# Patient Record
Sex: Male | Born: 1970 | Race: White | Hispanic: No | Smoking: Never smoker
Health system: Southern US, Community
[De-identification: ages and names within clinical notes are randomized; demographics above are authoritative.]

---

## 2011-08-28 ENCOUNTER — Emergency Department (HOSPITAL_COMMUNITY): Payer: No Typology Code available for payment source

## 2011-08-28 ENCOUNTER — Emergency Department (HOSPITAL_COMMUNITY): Payer: No Typology Code available for payment source | Admitting: Certified Registered"

## 2011-08-28 ENCOUNTER — Encounter (HOSPITAL_COMMUNITY): Admission: EM | Disposition: A | Discharge status: Home Health | Payer: Self-pay | Source: Home / Self Care

## 2011-08-28 ENCOUNTER — Encounter (HOSPITAL_COMMUNITY): Payer: Self-pay | Admitting: Certified Registered"

## 2011-08-28 ENCOUNTER — Encounter: Payer: Self-pay | Admitting: Physical Medicine and Rehabilitation

## 2011-08-28 ENCOUNTER — Inpatient Hospital Stay (HOSPITAL_COMMUNITY)
Admission: EM | Admit: 2011-08-28 | Discharge: 2011-09-02 | DRG: 481 | Disposition: A | Discharge status: Home Health | Payer: No Typology Code available for payment source | Attending: General Surgery | Admitting: General Surgery

## 2011-08-28 DIAGNOSIS — S7291XA Unspecified fracture of right femur, initial encounter for closed fracture: Secondary | ICD-10-CM

## 2011-08-28 DIAGNOSIS — G4730 Sleep apnea, unspecified: Secondary | ICD-10-CM | POA: Diagnosis present

## 2011-08-28 DIAGNOSIS — S301XXA Contusion of abdominal wall, initial encounter: Secondary | ICD-10-CM | POA: Diagnosis present

## 2011-08-28 DIAGNOSIS — S61219A Laceration without foreign body of unspecified finger without damage to nail, initial encounter: Secondary | ICD-10-CM

## 2011-08-28 DIAGNOSIS — T07XXXA Unspecified multiple injuries, initial encounter: Secondary | ICD-10-CM

## 2011-08-28 DIAGNOSIS — S7290XA Unspecified fracture of unspecified femur, initial encounter for closed fracture: Secondary | ICD-10-CM

## 2011-08-28 DIAGNOSIS — IMO0002 Reserved for concepts with insufficient information to code with codable children: Secondary | ICD-10-CM | POA: Diagnosis present

## 2011-08-28 DIAGNOSIS — S72351A Displaced comminuted fracture of shaft of right femur, initial encounter for closed fracture: Secondary | ICD-10-CM | POA: Diagnosis present

## 2011-08-28 DIAGNOSIS — S7223XA Displaced subtrochanteric fracture of unspecified femur, initial encounter for closed fracture: Principal | ICD-10-CM | POA: Diagnosis present

## 2011-08-28 DIAGNOSIS — R000 Tachycardia, unspecified: Secondary | ICD-10-CM | POA: Diagnosis not present

## 2011-08-28 DIAGNOSIS — Y92410 Unspecified street and highway as the place of occurrence of the external cause: Secondary | ICD-10-CM

## 2011-08-28 DIAGNOSIS — S82841A Displaced bimalleolar fracture of right lower leg, initial encounter for closed fracture: Secondary | ICD-10-CM | POA: Diagnosis present

## 2011-08-28 DIAGNOSIS — D62 Acute posthemorrhagic anemia: Secondary | ICD-10-CM | POA: Diagnosis not present

## 2011-08-28 DIAGNOSIS — S7221XA Displaced subtrochanteric fracture of right femur, initial encounter for closed fracture: Secondary | ICD-10-CM | POA: Diagnosis present

## 2011-08-28 DIAGNOSIS — S82899A Other fracture of unspecified lower leg, initial encounter for closed fracture: Secondary | ICD-10-CM

## 2011-08-28 DIAGNOSIS — S72309A Unspecified fracture of shaft of unspecified femur, initial encounter for closed fracture: Secondary | ICD-10-CM | POA: Diagnosis present

## 2011-08-28 DIAGNOSIS — S82843A Displaced bimalleolar fracture of unspecified lower leg, initial encounter for closed fracture: Secondary | ICD-10-CM

## 2011-08-28 HISTORY — PX: ORIF ANKLE FRACTURE: SHX5408

## 2011-08-28 HISTORY — PX: FEMUR IM NAIL: SHX1597

## 2011-08-28 LAB — CBC
HCT: 44.6 % (ref 39.0–52.0)
Hemoglobin: 16.3 g/dL (ref 13.0–17.0)
MCH: 31.5 pg (ref 26.0–34.0)
MCHC: 36.5 g/dL — ABNORMAL HIGH (ref 30.0–36.0)
MCV: 86.3 fL (ref 78.0–100.0)
Platelets: 210 K/uL (ref 150–400)
RBC: 5.17 MIL/uL (ref 4.22–5.81)
RDW: 12.3 % (ref 11.5–15.5)
WBC: 9.6 10*3/uL (ref 4.0–10.5)

## 2011-08-28 LAB — BASIC METABOLIC PANEL
BUN: 14 mg/dL (ref 6–23)
CO2: 21 mEq/L (ref 19–32)
Chloride: 100 mEq/L (ref 96–112)
Creatinine, Ser: 1.06 mg/dL (ref 0.50–1.35)
GFR calc Af Amer: >90 mL/min (ref >90)
Potassium: 5.5 mEq/L — ABNORMAL HIGH (ref 3.5–5.1)

## 2011-08-28 LAB — PROTIME-INR
INR: 1.01 (ref 0.00–1.49)
Prothrombin Time: 13.5 s (ref 11.6–15.2)

## 2011-08-28 LAB — BASIC METABOLIC PANEL WITH GFR
Calcium: 8.7 mg/dL (ref 8.4–10.5)
GFR calc non Af Amer: 86 mL/min — ABNORMAL LOW (ref >90)
Glucose, Bld: 147 mg/dL — ABNORMAL HIGH (ref 70–99)
Sodium: 136 meq/L (ref 135–145)

## 2011-08-28 LAB — APTT: aPTT: 23 seconds — ABNORMAL LOW (ref 24–37)

## 2011-08-28 SURGERY — INSERTION, INTRAMEDULLARY ROD, FEMUR
Anesthesia: General | Site: Thigh | Laterality: Right | Wound class: Clean

## 2011-08-28 MED ORDER — WARFARIN SODIUM 7.5 MG PO TABS
7.5000 mg | ORAL_TABLET | Freq: Once | ORAL | Status: AC
  Administered 2011-08-28: 7.5 mg via ORAL
  Filled 2011-08-28: qty 1

## 2011-08-28 MED ORDER — KETOROLAC TROMETHAMINE 30 MG/ML IJ SOLN
INTRAMUSCULAR | Status: DC | PRN
  Administered 2011-08-28: 30 mg via INTRAVENOUS

## 2011-08-28 MED ORDER — MEPERIDINE HCL 25 MG/ML IJ SOLN: 6.2500 mg | INTRAMUSCULAR | Status: DC | PRN

## 2011-08-28 MED ORDER — ONDANSETRON HCL 4 MG/2ML IJ SOLN
INTRAMUSCULAR | Status: DC | PRN
  Administered 2011-08-28: 4 mg via INTRAVENOUS

## 2011-08-28 MED ORDER — WARFARIN VIDEO: Freq: Once | Status: DC

## 2011-08-28 MED ORDER — LACTATED RINGERS IV SOLN
INTRAVENOUS | Status: DC | PRN
  Administered 2011-08-28 (×3): via INTRAVENOUS

## 2011-08-28 MED ORDER — CEFAZOLIN SODIUM-DEXTROSE 2-3 GM-% IV SOLR
2.0000 g | Freq: Once | INTRAVENOUS | Status: AC
  Administered 2011-08-28: 2 g via INTRAVENOUS

## 2011-08-28 MED ORDER — ZOLPIDEM TARTRATE 5 MG PO TABS: 5.0000 mg | ORAL_TABLET | Freq: Every evening | ORAL | Status: DC | PRN

## 2011-08-28 MED ORDER — ONDANSETRON HCL 4 MG/2ML IJ SOLN
4.0000 mg | Freq: Four times a day (QID) | INTRAMUSCULAR | Status: DC | PRN
  Administered 2011-08-28: 4 mg via INTRAVENOUS

## 2011-08-28 MED ORDER — SUFENTANIL CITRATE 50 MCG/ML IV SOLN
INTRAVENOUS | Status: DC | PRN
  Administered 2011-08-28: 40 ug via INTRAVENOUS

## 2011-08-28 MED ORDER — MIDAZOLAM HCL 5 MG/5ML IJ SOLN
INTRAMUSCULAR | Status: DC | PRN
  Administered 2011-08-28: 2 mg via INTRAVENOUS

## 2011-08-28 MED ORDER — IOHEXOL 300 MG/ML  SOLN
100.0000 mL | Freq: Once | INTRAMUSCULAR | Status: AC | PRN
  Administered 2011-08-28: 100 mL via INTRAVENOUS

## 2011-08-28 MED ORDER — OXYCODONE-ACETAMINOPHEN 5-325 MG PO TABS
1.0000 | ORAL_TABLET | ORAL | Status: DC | PRN
  Administered 2011-08-28 – 2011-08-29 (×3): 2 via ORAL
  Administered 2011-08-29: 1 via ORAL
  Filled 2011-08-28 (×4): qty 2

## 2011-08-28 MED ORDER — FENTANYL CITRATE 0.05 MG/ML IJ SOLN
INTRAMUSCULAR | Status: DC | PRN
  Administered 2011-08-28: 100 ug via INTRAVENOUS
  Administered 2011-08-28 (×3): 50 ug via INTRAVENOUS

## 2011-08-28 MED ORDER — PROPOFOL 10 MG/ML IV BOLUS
INTRAVENOUS | Status: DC | PRN
  Administered 2011-08-28: 200 mg via INTRAVENOUS

## 2011-08-28 MED ORDER — SODIUM CHLORIDE 0.9 % IR SOLN
Status: DC | PRN
  Administered 2011-08-28: 1000 mL

## 2011-08-28 MED ORDER — METHOCARBAMOL 100 MG/ML IJ SOLN
500.0000 mg | INTRAVENOUS | Status: DC
  Administered 2011-08-28: 500 mg via INTRAVENOUS
  Filled 2011-08-28: qty 5

## 2011-08-28 MED ORDER — PATIENT'S GUIDE TO USING COUMADIN BOOK
Freq: Once | Status: DC
  Filled 2011-08-28 (×2): qty 1

## 2011-08-28 MED ORDER — ROCURONIUM BROMIDE 100 MG/10ML IV SOLN
INTRAVENOUS | Status: DC | PRN
  Administered 2011-08-28: 20 mg via INTRAVENOUS
  Administered 2011-08-28: 50 mg via INTRAVENOUS
  Administered 2011-08-28: 30 mg via INTRAVENOUS
  Administered 2011-08-28 (×2): 20 mg via INTRAVENOUS
  Administered 2011-08-28: 10 mg via INTRAVENOUS

## 2011-08-28 MED ORDER — PHENYLEPHRINE HCL 10 MG/ML IJ SOLN
INTRAMUSCULAR | Status: DC | PRN
  Administered 2011-08-28: 80 ug via INTRAVENOUS
  Administered 2011-08-28: 40 ug via INTRAVENOUS

## 2011-08-28 MED ORDER — IOHEXOL 300 MG/ML  SOLN: 100.0000 mL | Freq: Once | INTRAMUSCULAR | Status: DC | PRN

## 2011-08-28 MED ORDER — METHOCARBAMOL 100 MG/ML IJ SOLN
500.0000 mg | Freq: Four times a day (QID) | INTRAVENOUS | Status: DC | PRN
  Filled 2011-08-28: qty 5

## 2011-08-28 MED ORDER — ONDANSETRON HCL 4 MG/2ML IJ SOLN
INTRAMUSCULAR | Status: AC
  Administered 2011-08-28: 4 mg via INTRAVENOUS
  Filled 2011-08-28: qty 2

## 2011-08-28 MED ORDER — DEXTROSE 5 % IV SOLN
INTRAVENOUS | Status: DC | PRN
  Administered 2011-08-28: 07:00:00 via INTRAVENOUS

## 2011-08-28 MED ORDER — SODIUM CHLORIDE 0.9 % IV SOLN
Freq: Once | INTRAVENOUS | Status: AC
  Administered 2011-08-28: 03:00:00 via INTRAVENOUS

## 2011-08-28 MED ORDER — MORPHINE SULFATE 2 MG/ML IJ SOLN: 0.0500 mg/kg | INTRAMUSCULAR | Status: DC | PRN

## 2011-08-28 MED ORDER — ENOXAPARIN SODIUM 30 MG/0.3ML ~~LOC~~ SOLN
30.0000 mg | Freq: Two times a day (BID) | SUBCUTANEOUS | Status: DC
  Administered 2011-08-28 – 2011-09-02 (×10): 30 mg via SUBCUTANEOUS
  Filled 2011-08-28 (×13): qty 0.3

## 2011-08-28 MED ORDER — SODIUM CHLORIDE 0.9 % IV SOLN
INTRAVENOUS | Status: DC
  Administered 2011-08-28 – 2011-08-29 (×2): via INTRAVENOUS
  Administered 2011-08-30: 1000 mL via INTRAVENOUS

## 2011-08-28 MED ORDER — PROMETHAZINE HCL 25 MG/ML IJ SOLN
12.5000 mg | Freq: Four times a day (QID) | INTRAMUSCULAR | Status: DC | PRN
  Administered 2011-08-28: 12.5 mg via INTRAVENOUS
  Filled 2011-08-28 (×2): qty 1

## 2011-08-28 MED ORDER — LACTATED RINGERS IV SOLN
INTRAVENOUS | Status: DC | PRN
  Administered 2011-08-28 (×3): via INTRAVENOUS

## 2011-08-28 MED ORDER — ONDANSETRON HCL 4 MG/2ML IJ SOLN: 4.0000 mg | Freq: Once | INTRAMUSCULAR | Status: DC | PRN

## 2011-08-28 MED ORDER — METHOCARBAMOL 500 MG PO TABS
500.0000 mg | ORAL_TABLET | Freq: Four times a day (QID) | ORAL | Status: DC | PRN
  Administered 2011-08-28 – 2011-09-02 (×13): 500 mg via ORAL
  Filled 2011-08-28 (×13): qty 1

## 2011-08-28 MED ORDER — ONDANSETRON HCL 4 MG PO TABS: 4.0000 mg | ORAL_TABLET | Freq: Four times a day (QID) | ORAL | Status: DC | PRN

## 2011-08-28 MED ORDER — CEFAZOLIN SODIUM-DEXTROSE 2-3 GM-% IV SOLR
2.0000 g | Freq: Four times a day (QID) | INTRAVENOUS | Status: AC
  Administered 2011-08-28 – 2011-08-29 (×3): 2 g via INTRAVENOUS
  Filled 2011-08-28 (×3): qty 50

## 2011-08-28 MED ORDER — HYDROMORPHONE HCL PF 1 MG/ML IJ SOLN
0.2500 mg | INTRAMUSCULAR | Status: DC | PRN
  Administered 2011-08-28 (×2): 0.5 mg via INTRAVENOUS

## 2011-08-28 MED ORDER — CEFAZOLIN SODIUM 1-5 GM-% IV SOLN
INTRAVENOUS | Status: AC
  Filled 2011-08-28: qty 100

## 2011-08-28 MED ORDER — SUCCINYLCHOLINE CHLORIDE 20 MG/ML IJ SOLN
INTRAMUSCULAR | Status: DC | PRN
  Administered 2011-08-28: 100 mg via INTRAVENOUS

## 2011-08-28 MED ORDER — HYDROMORPHONE HCL PF 1 MG/ML IJ SOLN
1.0000 mg | INTRAMUSCULAR | Status: DC | PRN
  Administered 2011-08-28 (×4): 1 mg via INTRAVENOUS
  Administered 2011-08-29 (×2): 2 mg via INTRAVENOUS
  Administered 2011-08-29: 1 mg via INTRAVENOUS
  Administered 2011-08-29 – 2011-08-30 (×5): 2 mg via INTRAVENOUS
  Administered 2011-08-30: 1 mg via INTRAVENOUS
  Administered 2011-08-30 (×2): 2 mg via INTRAVENOUS
  Administered 2011-08-31: 1 mg via INTRAVENOUS
  Administered 2011-08-31 – 2011-09-01 (×5): 2 mg via INTRAVENOUS
  Filled 2011-08-28 (×2): qty 2
  Filled 2011-08-28: qty 1
  Filled 2011-08-28 (×5): qty 2
  Filled 2011-08-28: qty 1
  Filled 2011-08-28: qty 2
  Filled 2011-08-28 (×3): qty 1
  Filled 2011-08-28 (×5): qty 2
  Filled 2011-08-28 (×2): qty 1
  Filled 2011-08-28: qty 2

## 2011-08-28 SURGICAL SUPPLY — 103 items
1.6mm k-wire ×3 IMPLANT
BANDAGE ACE 4 STERILE (GAUZE/BANDAGES/DRESSINGS) ×3 IMPLANT
BANDAGE ELASTIC 6 VELCRO ST LF (GAUZE/BANDAGES/DRESSINGS) ×3 IMPLANT
BANDAGE ESMARK 6X9 LF (GAUZE/BANDAGES/DRESSINGS) ×2 IMPLANT
BIT DRILL 2.5X2.75 QC CALB (BIT) ×3 IMPLANT
BIT DRILL 3.5X5.5 QC CALB (BIT) ×3 IMPLANT
BIT DRILL 3.8X6 NS (BIT) ×3 IMPLANT
BIT DRILL 5.3 (BIT) ×3 IMPLANT
BIT DRILL 6.5X4.8 (BIT) ×3 IMPLANT
BIT DRILL CALIBRATED 2.7 (BIT) ×3 IMPLANT
BLADE SURG 10 STRL SS (BLADE) ×3 IMPLANT
BLADE SURG ROTATE 9660 (MISCELLANEOUS) ×3 IMPLANT
BNDG ESMARK 6X9 LF (GAUZE/BANDAGES/DRESSINGS) ×3
CLOTH BEACON ORANGE TIMEOUT ST (SAFETY) ×3 IMPLANT
COVER MAYO STAND STRL (DRAPES) ×3 IMPLANT
COVER SURGICAL LIGHT HANDLE (MISCELLANEOUS) ×6 IMPLANT
CUFF TOURNIQUET SINGLE 24IN (TOURNIQUET CUFF) ×3 IMPLANT
CUFF TOURNIQUET SINGLE 34IN LL (TOURNIQUET CUFF) IMPLANT
CUFF TOURNIQUET SINGLE 44IN (TOURNIQUET CUFF) IMPLANT
DECANTER SPIKE VIAL GLASS SM (MISCELLANEOUS) IMPLANT
DRAPE OEC MINIVIEW 54X84 (DRAPES) IMPLANT
DRAPE STERI IOBAN 125X83 (DRAPES) ×3 IMPLANT
DRAPE U-SHAPE 47X51 STRL (DRAPES) ×6 IMPLANT
DRSG MEPILEX BORDER 4X4 (GAUZE/BANDAGES/DRESSINGS) ×6 IMPLANT
DRSG MEPILEX BORDER 4X8 (GAUZE/BANDAGES/DRESSINGS) ×3 IMPLANT
DRSG PAD ABDOMINAL 8X10 ST (GAUZE/BANDAGES/DRESSINGS) ×3 IMPLANT
DURAPREP 26ML APPLICATOR (WOUND CARE) ×6 IMPLANT
ELECT CAUTERY BLADE 6.4 (BLADE) ×3 IMPLANT
ELECT REM PT RETURN 9FT ADLT (ELECTROSURGICAL) ×3
ELECTRODE REM PT RTRN 9FT ADLT (ELECTROSURGICAL) ×2 IMPLANT
EVACUATOR 1/8 PVC DRAIN (DRAIN) IMPLANT
FACESHIELD OPICON STD (MASK) ×3 IMPLANT
GAUZE XEROFORM 1X8 LF (GAUZE/BANDAGES/DRESSINGS) IMPLANT
GAUZE XEROFORM 5X9 LF (GAUZE/BANDAGES/DRESSINGS) ×3 IMPLANT
GLOVE BIO SURGEON STRL SZ 6.5 (GLOVE) ×3 IMPLANT
GLOVE BIOGEL PI IND STRL 6.5 (GLOVE) ×2 IMPLANT
GLOVE BIOGEL PI IND STRL 8 (GLOVE) ×4 IMPLANT
GLOVE BIOGEL PI IND STRL 8.5 (GLOVE) ×2 IMPLANT
GLOVE BIOGEL PI INDICATOR 6.5 (GLOVE) ×1
GLOVE BIOGEL PI INDICATOR 8 (GLOVE) ×2
GLOVE BIOGEL PI INDICATOR 8.5 (GLOVE) ×1
GLOVE BIOGEL PI ORTHO PRO 7.5 (GLOVE) ×1
GLOVE ECLIPSE 7.5 STRL STRAW (GLOVE) ×6 IMPLANT
GLOVE PI ORTHO PRO STRL 7.5 (GLOVE) ×2 IMPLANT
GLOVE SS BIOGEL STRL SZ 7 (GLOVE) ×4 IMPLANT
GLOVE SUPERSENSE BIOGEL SZ 7 (GLOVE) ×2
GOWN PREVENTION PLUS XLARGE (GOWN DISPOSABLE) ×3 IMPLANT
GOWN SRG XL XLNG 56XLVL 4 (GOWN DISPOSABLE) ×2 IMPLANT
GOWN STRL NON-REIN LRG LVL3 (GOWN DISPOSABLE) ×3 IMPLANT
GOWN STRL NON-REIN XL XLG LVL4 (GOWN DISPOSABLE) ×1
GUIDEPIN 3.2X17.5 THRD DISP (PIN) ×6 IMPLANT
GUIDEWIRE BALL NOSE 100CM (WIRE) ×9 IMPLANT
K-WIRE ACE 1.6X6 (WIRE) ×12
KIT BASIN OR (CUSTOM PROCEDURE TRAY) ×3 IMPLANT
KIT ROOM TURNOVER OR (KITS) ×3 IMPLANT
KWIRE ACE 1.6X6 (WIRE) ×8 IMPLANT
MANIFOLD NEPTUNE II (INSTRUMENTS) ×3 IMPLANT
NAIL TROCH ENTRY RT 11X36 (Nail) ×3 IMPLANT
NS IRRIG 1000ML POUR BTL (IV SOLUTION) ×3 IMPLANT
PACK GENERAL/GYN (CUSTOM PROCEDURE TRAY) ×3 IMPLANT
PACK ORTHO EXTREMITY (CUSTOM PROCEDURE TRAY) ×3 IMPLANT
PAD ARMBOARD 7.5X6 YLW CONV (MISCELLANEOUS) ×6 IMPLANT
PAD CAST 4YDX4 CTTN HI CHSV (CAST SUPPLIES) IMPLANT
PADDING CAST COTTON 4X4 STRL (CAST SUPPLIES)
PADDING WEBRIL 4 STERILE (GAUZE/BANDAGES/DRESSINGS) ×3 IMPLANT
PADDING WEBRIL 6 STERILE (GAUZE/BANDAGES/DRESSINGS) ×3 IMPLANT
PENCIL BUTTON HOLSTER BLD 10FT (ELECTRODE) ×3 IMPLANT
PLATE LOCK 4H 109 RT DIST FIB (Plate) ×3 IMPLANT
SCREW ACE CORTICAL (Screw) ×1 IMPLANT
SCREW ACECAP 42MM (Screw) ×3 IMPLANT
SCREW ACECAP 48MM (Screw) ×3 IMPLANT
SCREW BN FT 75X6.5XST DRV (Screw) ×2 IMPLANT
SCREW CANCELLOUS 6.5X90 (Screw) ×3 IMPLANT
SCREW CORTICAL 3.5MM  16MM (Screw) ×1 IMPLANT
SCREW CORTICAL 3.5MM 16MM (Screw) ×2 IMPLANT
SCREW CORTICAL 3.5MM 18MM (Screw) ×3 IMPLANT
SCREW LOCK CORT STAR 3.5X10 (Screw) ×9 IMPLANT
SCREW LOCK CORT STAR 3.5X12 (Screw) ×6 IMPLANT
SCREW LOCK CORT STAR 3.5X14 (Screw) ×6 IMPLANT
SCREW LOCK CORT STAR 3.5X16 (Screw) ×3 IMPLANT
SCREW LOW PROFILE 18MMX3.5MM (Screw) ×3 IMPLANT
SCREW NON LOCKING LP 3.5 16MM (Screw) ×3 IMPLANT
SCREWDRIVER HEX TIP 3.5MM (MISCELLANEOUS) ×3 IMPLANT
SPLINT PLASTER CAST XFAST 5X30 (CAST SUPPLIES) ×2 IMPLANT
SPLINT PLASTER XFAST SET 5X30 (CAST SUPPLIES) ×1
SPONGE GAUZE 4X4 12PLY (GAUZE/BANDAGES/DRESSINGS) ×3 IMPLANT
SPONGE LAP 4X18 X RAY DECT (DISPOSABLE) ×6 IMPLANT
STAPLER VISISTAT 35W (STAPLE) ×3 IMPLANT
SUCTION FRAZIER TIP 10 FR DISP (SUCTIONS) ×3 IMPLANT
SUT ETHILON 4 0 PS 2 18 (SUTURE) IMPLANT
SUT VIC AB 0 CT1 27 (SUTURE) ×1
SUT VIC AB 0 CT1 27XBRD ANBCTR (SUTURE) ×2 IMPLANT
SUT VIC AB 0 CTB1 27 (SUTURE) ×6 IMPLANT
SUT VIC AB 1 CTB1 27 (SUTURE) ×3 IMPLANT
SUT VIC AB 2-0 CT1 27 (SUTURE) ×1
SUT VIC AB 2-0 CT1 TAPERPNT 27 (SUTURE) ×2 IMPLANT
SUT VIC AB 2-0 CTB1 (SUTURE) ×3 IMPLANT
SUT VIC AB 2-0 FS1 27 (SUTURE) IMPLANT
SYR CONTROL 10ML LL (SYRINGE) IMPLANT
TOWEL OR 17X24 6PK STRL BLUE (TOWEL DISPOSABLE) ×3 IMPLANT
TOWEL OR 17X26 10 PK STRL BLUE (TOWEL DISPOSABLE) ×3 IMPLANT
TUBE CONNECTING 12X1/4 (SUCTIONS) ×3 IMPLANT
WATER STERILE IRR 1000ML POUR (IV SOLUTION) ×3 IMPLANT

## 2011-08-28 NOTE — Anesthesia Preprocedure Evaluation (Addendum)
Anesthesia Evaluation  Patient identified by MRN, date of birth, ID band Patient awake    Reviewed: Allergy & Precautions, H&P , NPO status , Patient's Chart, lab work & pertinent test results  Airway Mallampati: I TM Distance: >3 FB Neck ROM: Full    Dental  (+) Teeth Intact and Dental Advisory Given   Pulmonary sleep apnea ,    Pulmonary exam normal       Cardiovascular neg cardio ROS Regular Normal    Neuro/Psych    GI/Hepatic negative GI ROS, Neg liver ROS,   Endo/Other  Negative Endocrine ROS  Renal/GU negative Renal ROS     Musculoskeletal negative musculoskeletal ROS (+)   Abdominal   Peds  Hematology negative hematology ROS (+)   Anesthesia Other Findings   Reproductive/Obstetrics negative OB ROS                        Anesthesia Physical Anesthesia Plan  ASA: I and Emergent  Anesthesia Plan: General   Post-op Pain Management:    Induction: Intravenous, Rapid sequence and Cricoid pressure planned  Airway Management Planned: Oral ETT  Additional Equipment:   Intra-op Plan:   Post-operative Plan: Extubation in OR  Informed Consent: I have reviewed the patients History and Physical, chart, labs and discussed the procedure including the risks, benefits and alternatives for the proposed anesthesia with the patient or authorized representative who has indicated his/her understanding and acceptance.   Dental advisory given and Only emergency history available  Plan Discussed with: CRNA, Anesthesiologist and Surgeon  Anesthesia Plan Comments:        Anesthesia Quick Evaluation
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
## 2011-08-28 NOTE — ED Notes (Signed)
Pt remains in radiology 
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
## 2011-08-28 NOTE — Transfer of Care (Signed)
Immediate Anesthesia Transfer of Care Note  Patient: Sean Reynolds  Procedure(s) Performed:  INTRAMEDULLARY (IM) NAIL FEMORAL; OPEN REDUCTION INTERNAL FIXATION (ORIF) ANKLE FRACTURE  Patient Location: PACU  Anesthesia Type: General  Level of Consciousness: awake, alert  and oriented  Airway & Oxygen Therapy: Patient Spontanous Breathing and Patient connected to nasal cannula oxygen  Post-op Assessment: Report given to PACU RN and Post -op Vital signs reviewed and stable  Post vital signs: Reviewed and stable  Complications: No apparent anesthesia complications
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
## 2011-08-28 NOTE — ED Notes (Signed)
Pt resting quietly at the time. Remains on cardiac monitor. He is conscious alert and oriented x4. 9/10 pain to R leg, splint remains in place. Vital signs stable.

## 2011-08-28 NOTE — Progress Notes (Signed)
Took deep breaths

## 2011-08-28 NOTE — Preoperative (Signed)
Beta Blockers   Reason not to administer Beta Blockers:Not Applicable 
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
## 2011-08-28 NOTE — OR Nursing (Signed)
Right femoral IM nail procedure ended at 0825. Pt repositioned for ORIF right ankle at 0831. ORIF right ankle started at 0838.

## 2011-08-28 NOTE — Anesthesia Postprocedure Evaluation (Signed)
  Anesthesia Post-op Note  Patient: Sean Reynolds  Procedure(s) Performed:  INTRAMEDULLARY (IM) NAIL FEMORAL; OPEN REDUCTION INTERNAL FIXATION (ORIF) ANKLE FRACTURE  Patient Location: PACU  Anesthesia Type: General  Level of Consciousness: awake, sedated and patient cooperative  Airway and Oxygen Therapy: Patient Spontanous Breathing and Patient connected to nasal cannula oxygen  Post-op Pain: mild  Post-op Assessment: Post-op Vital signs reviewed, Patient's Cardiovascular Status Stable, Respiratory Function Stable, Patent Airway, No signs of Nausea or vomiting and Pain level controlled  Post-op Vital Signs: stable  Complications: No apparent anesthesia complications
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
## 2011-08-29 DIAGNOSIS — S82841A Displaced bimalleolar fracture of right lower leg, initial encounter for closed fracture: Secondary | ICD-10-CM | POA: Diagnosis present

## 2011-08-29 DIAGNOSIS — S72351A Displaced comminuted fracture of shaft of right femur, initial encounter for closed fracture: Secondary | ICD-10-CM | POA: Diagnosis present

## 2011-08-29 DIAGNOSIS — S7221XA Displaced subtrochanteric fracture of right femur, initial encounter for closed fracture: Secondary | ICD-10-CM | POA: Diagnosis present

## 2011-08-29 DIAGNOSIS — T07XXXA Unspecified multiple injuries, initial encounter: Secondary | ICD-10-CM | POA: Diagnosis present

## 2011-08-29 LAB — CBC
HCT: 31.5 % — ABNORMAL LOW (ref 39.0–52.0)
Hemoglobin: 10.6 g/dL — ABNORMAL LOW (ref 13.0–17.0)
MCH: 29.6 pg (ref 26.0–34.0)
MCHC: 33.7 g/dL (ref 30.0–36.0)
MCV: 88 fL (ref 78.0–100.0)
RBC: 3.58 MIL/uL — ABNORMAL LOW (ref 4.22–5.81)

## 2011-08-29 LAB — BASIC METABOLIC PANEL
BUN: 9 mg/dL (ref 6–23)
CO2: 29 mEq/L (ref 19–32)
Chloride: 106 mEq/L (ref 96–112)
GFR calc non Af Amer: >90 mL/min (ref >90)
Glucose, Bld: 141 mg/dL — ABNORMAL HIGH (ref 70–99)
Potassium: 4 mEq/L (ref 3.5–5.1)
Sodium: 138 mEq/L (ref 135–145)

## 2011-08-29 MED ORDER — OXYCODONE-ACETAMINOPHEN 5-325 MG PO TABS
1.0000 | ORAL_TABLET | ORAL | Status: DC | PRN
  Administered 2011-08-29: 1 via ORAL
  Administered 2011-08-29 – 2011-08-30 (×3): 2 via ORAL
  Administered 2011-08-30: 1 via ORAL
  Administered 2011-08-30 – 2011-09-01 (×10): 2 via ORAL
  Filled 2011-08-29 (×15): qty 2

## 2011-08-29 MED ORDER — WARFARIN SODIUM 7.5 MG PO TABS
7.5000 mg | ORAL_TABLET | Freq: Once | ORAL | Status: AC
  Administered 2011-08-29: 7.5 mg via ORAL
  Filled 2011-08-29: qty 1

## 2011-08-29 NOTE — Progress Notes (Signed)
PATIENT ID: Sean Reynolds  MRN: [id number]  DOB/AGE:  [birth year] / 40 y.o.  1 Day Post-Op Procedure(s) (LRB): INTRAMEDULLARY (IM) NAIL FEMORAL (Right) OPEN REDUCTION INTERNAL FIXATION (ORIF) ANKLE FRACTURE (Right)    PROGRESS NOTE Subjective: Patient is alert, oriented,noNausea, no Vomiting, yes passing gas, no Bowel Movement. Taking PO well. Denies SOB, Chest or Calf Pain. Using [employer]. Patient reports pain as moderate  .    Objective: Vital signs in last 24 hours: Filed Vitals:   08/28/11 1314 08/28/11 2220 08/29/11 0230 08/29/11 0655  BP:  132/75 121/68 119/67  Pulse:  105 92 88  Temp:  99.7 F (37.6 C) 99.5 F (37.5 C) 98.6 F (37 C)  TempSrc:      Resp:  16 16 16   Height: 5\' 9"  (1.753 m)     Weight: 88.451 kg (195 lb)     SpO2:  96% 100% 100%      Intake/Output from previous day: I/O last 3 completed shifts: In: 5220 [P.O.:120; I.V.:5100] Out: 1775 [Urine:1525; Blood:250]   Intake/Output this shift: Total I/O In: 120 [P.O.:120] Out: 800 [Urine:800]   LABORATORY DATA:  Basename 08/29/11 0630 08/28/11 0234  WBC 9.3 9.6  HGB 10.6* 16.3  HCT 31.5* 44.6  PLT 120* 210  NA 138 136  K 4.0 5.5*  CL 106 100  CO2 29 21  BUN 9 14  CREATININE 0.90 1.06  GLUCOSE 141* 147*  GLUCAP -- --  INR 1.26 1.01  CALCIUM 7.7* --    Examination: Neurologically intact ABD soft Neurovascular intact Sensation intact distally Incision: dressing C/D/I}  Assessment:   1 Day Post-Op Procedure(s) (LRB): INTRAMEDULLARY (IM) NAIL FEMORAL (Right) OPEN REDUCTION INTERNAL FIXATION (ORIF) ANKLE FRACTURE (Right) ADDITIONAL DIAGNOSIS:  none  Plan: PT/OT non-weightbearing on right leg  DISCHARGE PLAN: Home when stable  DISCHARGE NEEDS: Dan Humphreys
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
## 2011-08-30 LAB — BASIC METABOLIC PANEL
BUN: 8 mg/dL (ref 6–23)
Chloride: 103 mEq/L (ref 96–112)
Creatinine, Ser: 0.94 mg/dL (ref 0.50–1.35)
GFR calc non Af Amer: >90 mL/min (ref >90)
Glucose, Bld: 130 mg/dL — ABNORMAL HIGH (ref 70–99)
Potassium: 3.9 mEq/L (ref 3.5–5.1)

## 2011-08-30 LAB — PROTIME-INR: Prothrombin Time: 23.5 seconds — ABNORMAL HIGH (ref 11.6–15.2)

## 2011-08-30 LAB — CBC
HCT: 29.2 % — ABNORMAL LOW (ref 39.0–52.0)
Hemoglobin: 10.1 g/dL — ABNORMAL LOW (ref 13.0–17.0)
MCH: 30.1 pg (ref 26.0–34.0)
MCHC: 34.6 g/dL (ref 30.0–36.0)
RDW: 12.3 % (ref 11.5–15.5)

## 2011-08-30 MED ORDER — WARFARIN SODIUM 2.5 MG PO TABS
2.5000 mg | ORAL_TABLET | Freq: Once | ORAL | Status: AC
  Administered 2011-08-30: 2.5 mg via ORAL
  Filled 2011-08-30: qty 1
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
## 2011-08-30 NOTE — Progress Notes (Signed)
Seen, agree with above May need suppository in AM if no BM.
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
## 2011-08-31 LAB — CBC
HCT: 29.9 % — ABNORMAL LOW (ref 39.0–52.0)
Hemoglobin: 10.2 g/dL — ABNORMAL LOW (ref 13.0–17.0)
MCH: 29.4 pg (ref 26.0–34.0)
MCHC: 34.1 g/dL (ref 30.0–36.0)

## 2011-08-31 MED ORDER — WARFARIN SODIUM 2.5 MG PO TABS
2.5000 mg | ORAL_TABLET | Freq: Once | ORAL | Status: AC
  Administered 2011-08-31: 2.5 mg via ORAL
  Filled 2011-08-31: qty 1

## 2011-08-31 MED ORDER — BACITRACIN ZINC 500 UNIT/GM EX OINT
TOPICAL_OINTMENT | Freq: Two times a day (BID) | CUTANEOUS | Status: DC
  Administered 2011-08-31 – 2011-09-02 (×5): via TOPICAL
  Filled 2011-08-31: qty 15

## 2011-08-31 NOTE — Progress Notes (Signed)
Clinical Social Worker completed the psychosocial assessment which can be found in the shadow chart.  Patient plans to stay with ex-wife or cousin at discharge to have 24 hour support.  Patient does not agree to SNF search at this time.  Clinical Social Worker has completed SBIRT with patient at bedside.  Patient with no current ETOH concerns.  Clinical Social Worker will sign off for now as social work intervention is no longer needed. Please consult [place] again if new need arises.  [address] [place], [place] [phone]
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
## 2011-08-31 NOTE — Progress Notes (Signed)
Physical Therapy Treatment Patient Details Name: Sean Reynolds MRN: [id number] DOB: 1971-05-09 Today's Date: 08/31/2011  PT Assessment/Plan  PT - Assessment/Plan Comments on Treatment Session: Improving slowly; pt reports he will have 24 hour assist at home from various family members; if he goes to his own apt , he has a flight of steps to enter (may need ambulance transport versus wheelchair boost up steps with at least two person assist) -- pt also looking into possibilty of going to ex-wife's house (one level with level entry); if for home, will need RW, wheelchair, BSC and HHPT; still if slow progress, will need to continue to consider SNF PT Plan: Discharge plan needs to be updated PT Frequency: Min 6X/week Follow Up Recommendations: Skilled nursing facility;Home health PT;24 hour supervision/assistance (SNF versus home with 24hr assist) Equipment Recommended: Rolling walker with 5" wheels;3 in 1 bedside comode;Wheelchair (measurements) PT Goals  Acute Rehab PT Goals Pt will go Supine/Side to Sit: with min assist PT Goal: Supine/Side to Sit - Progress: Progressing toward goal Pt will Sit at [facility] of Bed: with supervision PT Goal: Sit at [facility] Of Bed - Progress: Progressing toward goal Pt will go Sit to Stand: with min assist PT Goal: Sit to Stand - Progress: Progressing toward goal Pt will Transfer Bed to Chair/Chair to Bed: with min assist PT Transfer Goal: Bed to Chair/Chair to Bed - Progress: Progressing toward goal Pt will Ambulate: 16 - 50 feet;with min assist;with rolling walker PT Goal: Ambulate - Progress: Progressing toward goal  PT Treatment Precautions/Restrictions  Precautions Precautions: Fall Restrictions Weight Bearing Restrictions: Yes RLE Weight Bearing: Non weight bearing Mobility (including Balance) Bed Mobility Supine to Sit: 1: +2 Total assist;Patient percentage (comment);With rails (pt=50%) Supine to Sit Details (indicate cue type and reason): extremely  slow moving; very internally distracted by pain; step-by-step cues for technique; cues to attend to each small step in task at a time; pt was incontinent of urine upon arrival (nurse tech assisted with bath sitting EOB) Sitting - Scoot to Edge of Bed: 1: +2 Total assist;Patient percentage (comment) (pt=55%) Sitting - Scoot to Edge of Bed Details (indicate cue type and reason): cues to square hips at EOB; extremely slow scooting and pt almost persverating about lowering R foot to floor slowly Transfers Transfers: Yes Sit to Stand: 1: +2 Total assist;Patient percentage (comment);From bed (pt = 75%) Sit to Stand Details (indicate cue type and reason): cues for technique, safe hand placement; physical assist to steady RW Stand to Sit: 3: Mod assist;To chair/3-in-1;With armrests;With upper extremity assist Stand to Sit Details: physical assist for Right LE; very good control with stand to sit with UE assist Ambulation/Gait Ambulation/Gait: Yes Ambulation/Gait Assistance: 1: +2 Total assist;Patient percentage (comment) (pt =75%) Ambulation/Gait Assistance Details (indicate cue type and reason): pivot toe-heel on left foot bed to chair; difficulty keeping RLE off of floor, difficulty with NWB Ambulation Distance (Feet): 3 Feet Assistive device: Rolling walker    Exercise    End of Session PT - End of Session Equipment Utilized During Treatment: Gait belt Activity Tolerance: Patient limited by pain (limited by anxiety) Patient left: in chair;with call bell in reach Nurse Communication: Mobility status for transfers;Mobility status for ambulation General Behavior During Session: [facility] for tasks performed Cognition: [facility] for tasks performed (very internally distracted by pain)  Olen Pel [place], [place] [phone]  08/31/2011, 9:25 AM
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
## 2011-09-01 ENCOUNTER — Encounter (HOSPITAL_COMMUNITY): Payer: Self-pay | Admitting: Orthopedic Surgery

## 2011-09-01 DIAGNOSIS — D62 Acute posthemorrhagic anemia: Secondary | ICD-10-CM | POA: Diagnosis not present

## 2011-09-01 MED ORDER — HYDROMORPHONE HCL PF 1 MG/ML IJ SOLN
0.5000 mg | INTRAMUSCULAR | Status: DC | PRN
  Administered 2011-09-01 – 2011-09-02 (×3): 0.5 mg via INTRAVENOUS
  Filled 2011-09-01 (×3): qty 1

## 2011-09-01 MED ORDER — OXYCODONE HCL 5 MG PO TABS
10.0000 mg | ORAL_TABLET | ORAL | Status: DC | PRN
  Administered 2011-09-01: 20 mg via ORAL
  Administered 2011-09-01: 15 mg via ORAL
  Administered 2011-09-01 – 2011-09-02 (×5): 20 mg via ORAL
  Filled 2011-09-01 (×3): qty 4
  Filled 2011-09-01: qty 3
  Filled 2011-09-01 (×3): qty 4

## 2011-09-01 MED ORDER — WARFARIN SODIUM 5 MG PO TABS
5.0000 mg | ORAL_TABLET | Freq: Once | ORAL | Status: AC
  Administered 2011-09-01: 5 mg via ORAL
  Filled 2011-09-01: qty 1

## 2011-09-01 NOTE — Progress Notes (Signed)
Physical Therapy Treatment Patient Details Name: Gurshaan Matsuoka MRN: [id number] DOB: 10-Aug-1971 Today's Date: 09/01/2011  PT Assessment/Plan  PT - Assessment/Plan Comments on Treatment Session: Continued improvements (though slow), with patient able to manage room distances with RW  PT Plan: Discharge plan needs to be updated PT Frequency: Min 6X/week Follow Up Recommendations: Home health PT;24 hour supervision/assistance Equipment Recommended: Rolling walker with 5" wheels;3 in 1 bedside comode;Wheelchair (measurements);Wheelchair cushion (measurements) PT Goals  Acute Rehab PT Goals Time For Goal Achievement: 2 weeks Pt will go Supine/Side to Sit: with min assist PT Goal: Supine/Side to Sit - Progress: Other (comment) Pt will Sit at [facility] of Bed: with supervision PT Goal: Sit at [facility] Of Bed - Progress: Other (comment) Pt will go Sit to Stand: with min assist PT Goal: Sit to Stand - Progress: Progressing toward goal Pt will Transfer Bed to Chair/Chair to Bed: with min assist PT Transfer Goal: Bed to Chair/Chair to Bed - Progress: Progressing toward goal Pt will Ambulate: 16 - 50 feet;with min assist;with rolling walker PT Goal: Ambulate - Progress: Progressing toward goal  PT Treatment Precautions/Restrictions  Precautions Precautions: Fall Restrictions Weight Bearing Restrictions: Yes RLE Weight Bearing: Non weight bearing Mobility (including Balance) Transfers Sit to Stand: 3: Mod assist;With upper extremity assist;From chair/3-in-1 Sit to Stand Details (indicate cue type and reason): moving very slowly; very anwious re: moving RLE Stand to Sit: 3: Mod assist;To chair/3-in-1 Stand to Sit Details: physical assist to help extend RLE forward during descent to chair; good control of descent with UE assist Ambulation/Gait Ambulation/Gait Assistance: 4: Min assist Ambulation/Gait Assistance Details (indicate cue type and reason): constant monitoring for NWB RLE; which was much  improved today; demo cues for pressin gbody weight into RW for support and maintaining NWB RLE; extremely slow moving Ambulation Distance (Feet): 22 Feet Assistive device: Rolling walker Gait Pattern:  (NWB hop on Left) Gait velocity: extremely slow    Exercise    End of Session PT - End of Session Equipment Utilized During Treatment: Gait belt Activity Tolerance: Patient limited by pain Patient left: in chair;with call bell in reach Nurse Communication: Mobility status for transfers;Mobility status for ambulation General Behavior During Session: [facility] for tasks performed Cognition: [facility] for tasks performed  Van Clines [facility] [place], [place] [phone]  09/01/2011, 4:31 PM
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
## 2011-09-01 NOTE — Progress Notes (Signed)
Hope for D/C by tomorrow Patient examined and I agree with the assessment and plan  Paytience Bures E

## 2011-09-01 NOTE — Progress Notes (Signed)
ANTICOAGULATION CONSULT NOTE - Follow Up Consult  Pharmacy Consult for Coumadin with Lovenox bridging. Indication: VTE prophylaxis post-op for IM nail femoral, ORIF ankle fracture  No Known Allergies  Patient Measurements: Height: 5\' 9"  (175.3 cm) Weight: 195 lb (88.451 kg) IBW/kg (Calculated) : 70.7    Vital Signs: Temp: 98.6 F (37 C) (12/18 0600) Temp src: Oral (12/18 0600) BP: 120/67 mmHg (12/18 0600) Pulse Rate: 93  (12/18 0600)  Labs:  Basename 09/01/11 0610 08/31/11 0630 08/30/11 0500  HGB -- 10.2* 10.1*  HCT -- 29.9* 29.2*  PLT -- 124* 118*  APTT -- -- --  LABPROT 21.5* 23.2* 23.5*  INR 1.83* 2.02* 2.05*  HEPARINUNFRC -- -- --  CREATININE -- -- 0.94  CKTOTAL -- -- --  CKMB -- -- --  TROPONINI -- -- --   Estimated Creatinine Clearance: 115 ml/min (by C-G formula based on Cr of 0.94).   Medications:  Scheduled:     . bacitracin   Topical BID  . enoxaparin  30 mg Subcutaneous Q12H  . patient's guide to using coumadin book   Does not apply Once  . warfarin  2.5 mg Oral ONCE-1800  . warfarin   Does not apply Once    Assessment: Day #5 Coumadin with Lovenox bridge for VTE prophylaxis.  INR decreased after smaller doses of 2.5 mg given.  Goal of Therapy:  INR 2-3   Plan:  1. Lovenox per MD.  (Recommend continue until INR >2.0) 2. Coumadin 5 mg today.  Esperansa Sarabia Poteet, Pharm.D. 09/01/2011 8:35 AM
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
## 2011-09-01 NOTE — Progress Notes (Signed)
Subjective: 4 Days Post-Op Procedure(s) (LRB): INTRAMEDULLARY (IM) NAIL FEMORAL (Right) OPEN REDUCTION INTERNAL FIXATION (ORIF) ANKLE FRACTURE (Right) Patient reports pain as 4 on 0-10 scale.    Objective: Vital signs in last 24 hours: Temp:  [98.6 F (37 C)-99.9 F (37.7 C)] 98.6 F (37 C) (12/18 0600) Pulse Rate:  [93-102] 93  (12/18 0600) Resp:  [18-24] 20  (12/18 0600) BP: (119-120)/(67-82) 120/67 mmHg (12/18 0600) SpO2:  [94 %-96 %] 94 % (12/18 0600)  Intake/Output from previous day: 12/17 0701 - 12/18 0700 In: 630 [P.O.:480; I.V.:150] Out: 550 [Urine:550] Intake/Output this shift:     Basename 08/31/11 0630 08/30/11 0500  HGB 10.2* 10.1*    Basename 08/31/11 0630 08/30/11 0500  WBC 8.2 8.9  RBC 3.47* 3.36*  HCT 29.9* 29.2*  PLT 124* 118*    Basename 08/30/11 0500  NA 135  K 3.9  CL 103  CO2 24  BUN 8  CREATININE 0.94  GLUCOSE 130*  CALCIUM 7.9*    Basename 09/01/11 0610 08/31/11 0630  LABPT -- --  INR 1.83* 2.02*    Incision: dressing C/D/I Moderate right thigh swelling. Posterior splint intact to Right lower ext. Moves toes actively. Assessment/Plan: 4 Days Post-Op Procedure(s) (LRB): INTRAMEDULLARY (IM) NAIL FEMORAL (Right) OPEN REDUCTION INTERNAL FIXATION (ORIF) ANKLE FRACTURE (Right) Discharge home with home health when stable with PT NWB on Right lower ext Needs coumadin x 1 month post op. F/U Dr Luiz Blare in 2 weeks.  Eufemia Prindle G 09/01/2011, 9:43 AM
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
## 2011-09-02 MED ORDER — BACITRACIN ZINC 500 UNIT/GM EX OINT: TOPICAL_OINTMENT | Freq: Two times a day (BID) | CUTANEOUS | Status: AC

## 2011-09-02 MED ORDER — TRAMADOL HCL 50 MG PO TABS
100.0000 mg | ORAL_TABLET | Freq: Four times a day (QID) | ORAL | Status: DC
  Administered 2011-09-02 (×2): 100 mg via ORAL
  Filled 2011-09-02 (×2): qty 2

## 2011-09-02 MED ORDER — TRAMADOL HCL 50 MG PO TABS: 100.0000 mg | ORAL_TABLET | Freq: Four times a day (QID) | ORAL | Status: AC

## 2011-09-02 MED ORDER — OXYCODONE-ACETAMINOPHEN 10-325 MG PO TABS: 1.0000 | ORAL_TABLET | ORAL | Status: AC | PRN

## 2011-09-02 MED ORDER — WARFARIN SODIUM 5 MG PO TABS
5.0000 mg | ORAL_TABLET | Freq: Once | ORAL | Status: DC
  Filled 2011-09-02: qty 1

## 2011-09-02 MED ORDER — METHOCARBAMOL 500 MG PO TABS: 500.0000 mg | ORAL_TABLET | Freq: Four times a day (QID) | ORAL | Status: AC | PRN

## 2011-09-02 MED ORDER — WARFARIN SODIUM 5 MG PO TABS: ORAL_TABLET | ORAL | Status: DC

## 2011-09-02 NOTE — Progress Notes (Signed)
This patient has been seen and I agree with the findings and treatment plan.  Aariana Shankland O. Dawayne Ohair, III, MD, FACS [phone] (pager) [phone] (direct pager) Trauma Surgeon  
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
## 2011-09-02 NOTE — Progress Notes (Signed)
Physical Therapy Treatment Patient Details Name: Sean Reynolds MRN: [id number] DOB: Aug 30, 1971 Today's Date: 09/02/2011  PT Assessment/Plan  PT - Assessment/Plan Comments on Treatment Session: lengthy discussion re: optimal transport home, coordinating ambulance versus medical transport (many thanks to [employer], Sports coach), and planning for dc today; did not set wheelchair goal as pt is likely dcing today PT Plan: Discharge plan remains appropriate PT Frequency: Min 6X/week Follow Up Recommendations: Home health PT;24 hour supervision/assistance Equipment Recommended: Rolling walker with 5" wheels;3 in 1 bedside comode;Wheelchair (measurements);Wheelchair cushion (measurements) PT Goals  Acute Rehab PT Goals Time For Goal Achievement: 2 weeks Pt will go Supine/Side to Sit: with min assist PT Goal: Supine/Side to Sit - Progress: Progressing toward goal Pt will Sit at [facility] of Bed: with supervision PT Goal: Sit at [facility] Of Bed - Progress: Progressing toward goal Pt will go Sit to Stand: with min assist PT Goal: Sit to Stand - Progress: Progressing toward goal Pt will Transfer Bed to Chair/Chair to Bed: with min assist PT Transfer Goal: Bed to Chair/Chair to Bed - Progress: Progressing toward goal Pt will Ambulate: 16 - 50 feet;with min assist;with rolling walker PT Goal: Ambulate - Progress: Progressing toward goal  PT Treatment Precautions/Restrictions  Precautions Precautions: Fall Restrictions Weight Bearing Restrictions: Yes RLE Weight Bearing: Non weight bearing Mobility (including Balance) Transfers Sit to Stand: 3: Mod assist;With upper extremity assist;From chair/3-in-1 (also to wheelchair) Sit to Stand Details (indicate cue type and reason): cues and phyical assist for optimal prepositioning/scoot to edge of chair in prep for transfer Stand to Sit: 3: Mod assist;To chair/3-in-1 (and wheelchair) Stand to Sit Details: cues for safe hand  placement Ambulation/Gait Ambulation/Gait Assistance: 4: Min assist (without physical contact) Ambulation/Gait Assistance Details (indicate cue type and reason): good maintenance of NWB with short distances; pivotal steps recliner to wheelchair to recliner Ambulation Distance (Feet): 6 Feet Assistive device: Rolling walker Gait velocity: extremely slow Wheelchair Mobility Wheelchair Mobility: Yes Wheelchair Assistance: 3: Mod assist Wheelchair Assistance Details (indicate cue type and reason): demo cues for wheelchair management including elevating leg rests; mod physical assist for RLE and legrest positioning Wheelchair Propulsion: Both upper extremities;Left lower extremity Wheelchair Parts Management: Needs assistance Distance: 30    Exercise    End of Session PT - End of Session Equipment Utilized During Treatment: Gait belt Activity Tolerance: Patient limited by pain Patient left: in chair;with call bell in reach Nurse Communication: Mobility status for transfers;Mobility status for ambulation General Behavior During Session: [facility] for tasks performed Cognition: [facility] for tasks performed  [facility] [place], [place] [phone]  09/02/2011, 3:31 PM

## 2011-09-02 NOTE — Discharge Summary (Signed)
This patient has been seen and I agree with the findings and treatment plan.  Ilea Hilton O. Ardis Lawley, III, MD, FACS [phone] (pager) [phone] (direct pager) Trauma Surgeon  
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
## 2013-01-11 IMAGING — RF DG ANKLE COMPLETE 3+V*R*
1 series · 3 of 3 positions shown · non-contrast
Comparison: Earlier today at of [DATE] to

CLINICAL DATA: ORIF right ankle Intraop

RIGHT ANKLE - COMPLETE 3+ VIEW

[Series 1: run · 3 of 3 slices shown]
[im 1/3]
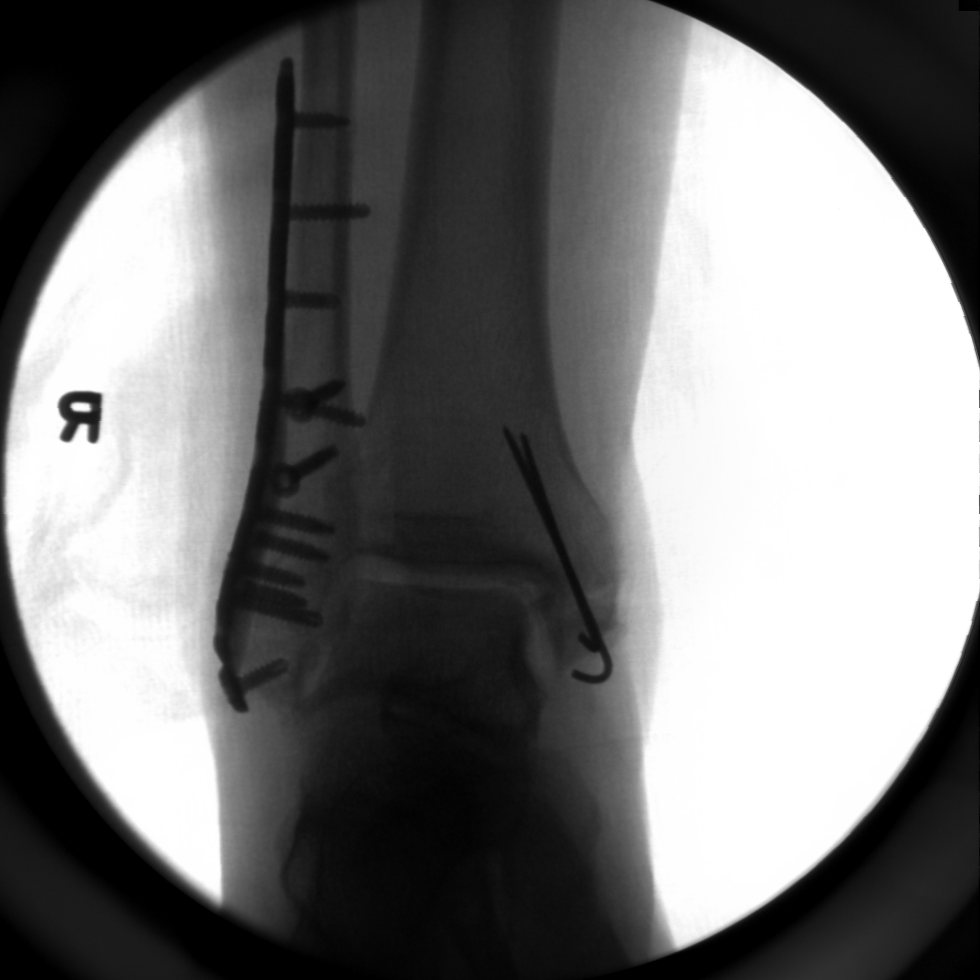
[im 2/3]
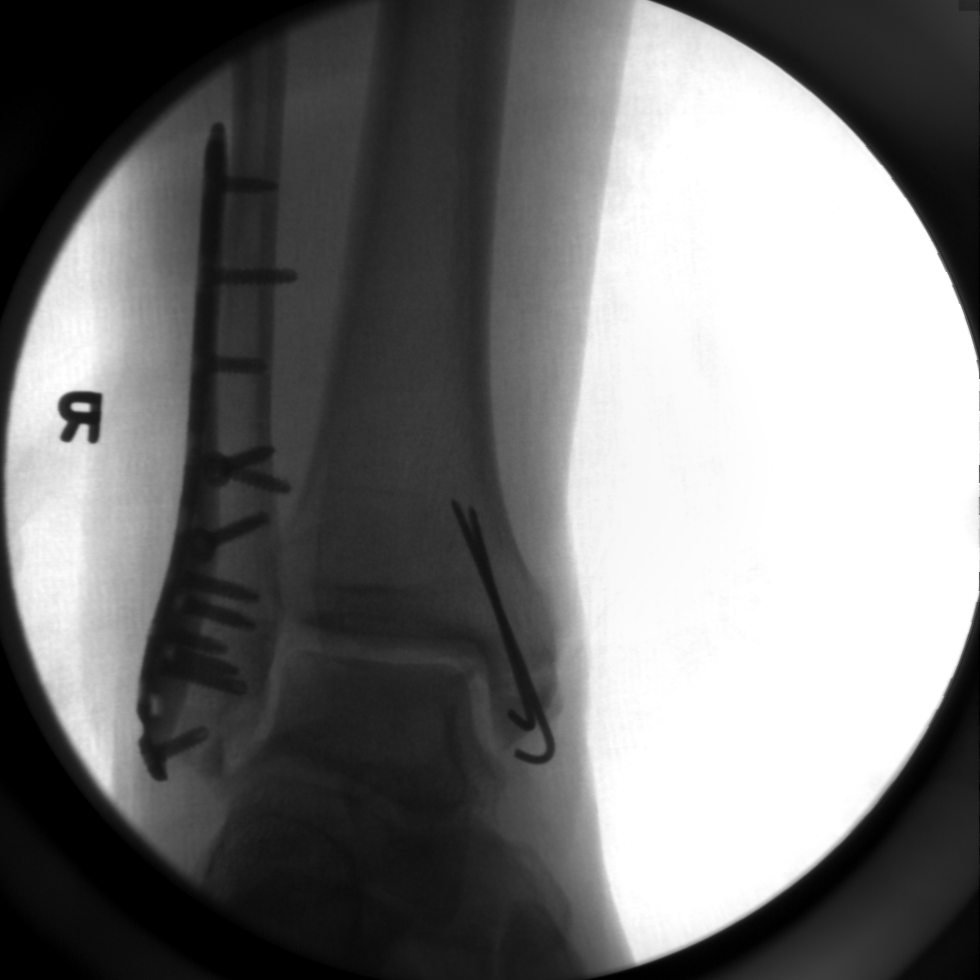
[im 3/3]
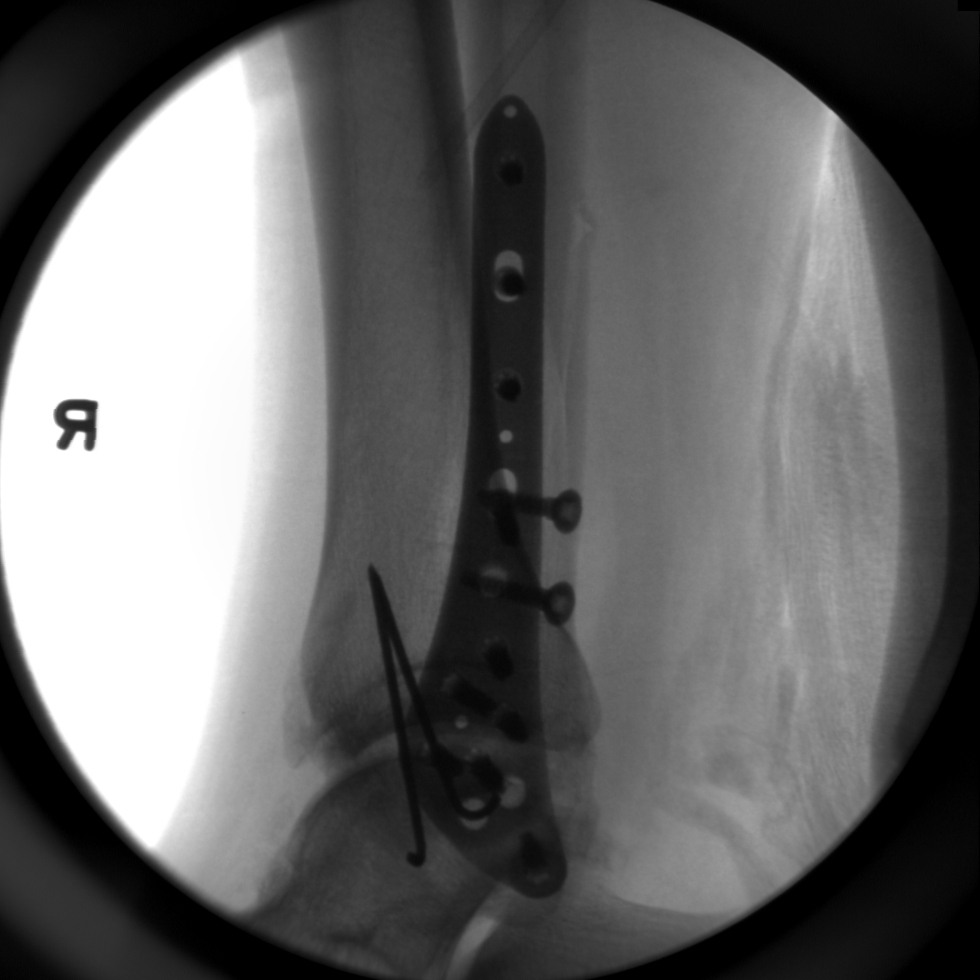

[3 of 3 positions shown; findings below may reference images not displayed]

FINDINGS: There is side plate and screw fixation of the distal
fibula which is now near anatomically aligned.  Two nails transfix
the comminuted medial malleolar fracture which is also near
anatomically aligned.  The ankle mortise is intact.
IMPRESSION: Anatomic alignment of distal fibular and tibial fractures after
ORIF.
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
## 2020-05-28 ENCOUNTER — Emergency Department (HOSPITAL_COMMUNITY): Payer: HRSA Program

## 2020-05-28 ENCOUNTER — Encounter (HOSPITAL_COMMUNITY): Payer: Self-pay | Admitting: Student

## 2020-05-28 ENCOUNTER — Inpatient Hospital Stay (HOSPITAL_COMMUNITY)
Admission: EM | Admit: 2020-05-28 | Discharge: 2020-06-05 | DRG: 177 | Disposition: A | Discharge status: Home / Self Care | Payer: HRSA Program | Attending: Internal Medicine | Admitting: Internal Medicine

## 2020-05-28 ENCOUNTER — Other Ambulatory Visit: Payer: Self-pay

## 2020-05-28 DIAGNOSIS — R7401 Elevation of levels of liver transaminase levels: Secondary | ICD-10-CM | POA: Diagnosis present

## 2020-05-28 DIAGNOSIS — R739 Hyperglycemia, unspecified: Secondary | ICD-10-CM | POA: Diagnosis present

## 2020-05-28 DIAGNOSIS — Z6835 Body mass index (BMI) 35.0-35.9, adult: Secondary | ICD-10-CM | POA: Diagnosis not present

## 2020-05-28 DIAGNOSIS — I2601 Septic pulmonary embolism with acute cor pulmonale: Secondary | ICD-10-CM | POA: Diagnosis not present

## 2020-05-28 DIAGNOSIS — U071 COVID-19: Principal | ICD-10-CM | POA: Diagnosis present

## 2020-05-28 DIAGNOSIS — R7303 Prediabetes: Secondary | ICD-10-CM | POA: Diagnosis present

## 2020-05-28 DIAGNOSIS — J9621 Acute and chronic respiratory failure with hypoxia: Secondary | ICD-10-CM | POA: Diagnosis present

## 2020-05-28 DIAGNOSIS — I5023 Acute on chronic systolic (congestive) heart failure: Secondary | ICD-10-CM | POA: Diagnosis present

## 2020-05-28 DIAGNOSIS — J1282 Pneumonia due to coronavirus disease 2019: Secondary | ICD-10-CM | POA: Diagnosis present

## 2020-05-28 DIAGNOSIS — J9601 Acute respiratory failure with hypoxia: Secondary | ICD-10-CM | POA: Diagnosis not present

## 2020-05-28 DIAGNOSIS — E669 Obesity, unspecified: Secondary | ICD-10-CM | POA: Diagnosis present

## 2020-05-28 DIAGNOSIS — E785 Hyperlipidemia, unspecified: Secondary | ICD-10-CM | POA: Diagnosis present

## 2020-05-28 DIAGNOSIS — R0602 Shortness of breath: Secondary | ICD-10-CM | POA: Diagnosis present

## 2020-05-28 DIAGNOSIS — E875 Hyperkalemia: Secondary | ICD-10-CM | POA: Diagnosis not present

## 2020-05-28 DIAGNOSIS — Z79899 Other long term (current) drug therapy: Secondary | ICD-10-CM | POA: Diagnosis not present

## 2020-05-28 DIAGNOSIS — Z7901 Long term (current) use of anticoagulants: Secondary | ICD-10-CM

## 2020-05-28 DIAGNOSIS — I2699 Other pulmonary embolism without acute cor pulmonale: Secondary | ICD-10-CM | POA: Diagnosis present

## 2020-05-28 DIAGNOSIS — R6510 Systemic inflammatory response syndrome (SIRS) of non-infectious origin without acute organ dysfunction: Secondary | ICD-10-CM | POA: Diagnosis not present

## 2020-05-28 DIAGNOSIS — I110 Hypertensive heart disease with heart failure: Secondary | ICD-10-CM | POA: Diagnosis present

## 2020-05-28 DIAGNOSIS — I2690 Septic pulmonary embolism without acute cor pulmonale: Secondary | ICD-10-CM | POA: Diagnosis not present

## 2020-05-28 LAB — COMPREHENSIVE METABOLIC PANEL WITH GFR
ALT: 52 U/L — ABNORMAL HIGH (ref 0–44)
AST: 46 U/L — ABNORMAL HIGH (ref 15–41)
Albumin: 3 g/dL — ABNORMAL LOW (ref 3.5–5.0)
Alkaline Phosphatase: 82 U/L (ref 38–126)
Anion gap: 17 — ABNORMAL HIGH (ref 5–15)
BUN: 23 mg/dL — ABNORMAL HIGH (ref 6–20)
CO2: 23 mmol/L (ref 22–32)
Calcium: 8.4 mg/dL — ABNORMAL LOW (ref 8.9–10.3)
Chloride: 99 mmol/L (ref 98–111)
Creatinine, Ser: 0.73 mg/dL (ref 0.61–1.24)
GFR calc Af Amer: >60 mL/min
GFR calc non Af Amer: >60 mL/min
Glucose, Bld: 172 mg/dL — ABNORMAL HIGH (ref 70–99)
Potassium: 3.5 mmol/L (ref 3.5–5.1)
Sodium: 139 mmol/L (ref 135–145)
Total Bilirubin: 0.7 mg/dL (ref 0.3–1.2)
Total Protein: 7.7 g/dL (ref 6.5–8.1)

## 2020-05-28 LAB — LACTATE DEHYDROGENASE: LDH: 493 U/L — ABNORMAL HIGH (ref 98–192)

## 2020-05-28 LAB — LACTIC ACID, PLASMA
Lactic Acid, Venous: 1.8 mmol/L (ref 0.5–1.9)
Lactic Acid, Venous: 1.6 mmol/L (ref 0.5–1.9)

## 2020-05-28 LAB — CBC WITH DIFFERENTIAL/PLATELET
Abs Immature Granulocytes: 0.81 10*3/uL — ABNORMAL HIGH (ref 0.00–0.07)
Basophils Absolute: 0 10*3/uL (ref 0.0–0.1)
Basophils Relative: 0 %
Eosinophils Absolute: 0 10*3/uL (ref 0.0–0.5)
Eosinophils Relative: 0 %
HCT: 41.5 % (ref 39.0–52.0)
Hemoglobin: 13.9 g/dL (ref 13.0–17.0)
Immature Granulocytes: 9 %
Lymphocytes Relative: 12 %
Lymphs Abs: 1.1 10*3/uL (ref 0.7–4.0)
MCH: 29.4 pg (ref 26.0–34.0)
MCHC: 33.5 g/dL (ref 30.0–36.0)
MCV: 87.7 fL (ref 80.0–100.0)
Monocytes Absolute: 0.5 10*3/uL (ref 0.1–1.0)
Monocytes Relative: 6 %
Neutro Abs: 6.7 10*3/uL (ref 1.7–7.7)
Neutrophils Relative %: 73 %
Platelets: 268 10*3/uL (ref 150–400)
RBC: 4.73 MIL/uL (ref 4.22–5.81)
RDW: 12.9 % (ref 11.5–15.5)
WBC: 9.2 10*3/uL (ref 4.0–10.5)
nRBC: 0.5 % — ABNORMAL HIGH (ref 0.0–0.2)

## 2020-05-28 LAB — TRIGLYCERIDES: Triglycerides: 264 mg/dL — ABNORMAL HIGH

## 2020-05-28 LAB — D-DIMER, QUANTITATIVE: D-Dimer, Quant: 3.64 ug/mL-FEU — ABNORMAL HIGH (ref 0.00–0.50)

## 2020-05-28 LAB — CBG MONITORING, ED: Glucose-Capillary: 342 mg/dL — ABNORMAL HIGH (ref 70–99)

## 2020-05-28 LAB — SARS CORONAVIRUS 2 BY RT PCR (HOSPITAL ORDER, PERFORMED IN ~~LOC~~ HOSPITAL LAB): SARS Coronavirus 2: POSITIVE — AB

## 2020-05-28 LAB — PROCALCITONIN: Procalcitonin: 0.29 ng/mL

## 2020-05-28 LAB — FIBRINOGEN: Fibrinogen: <60 mg/dL — CL (ref 210–475)

## 2020-05-28 LAB — C-REACTIVE PROTEIN: CRP: 20.7 mg/dL — ABNORMAL HIGH

## 2020-05-28 LAB — FERRITIN: Ferritin: 4340 ng/mL — ABNORMAL HIGH (ref 24–336)

## 2020-05-28 MED ORDER — ALBUTEROL SULFATE HFA 108 (90 BASE) MCG/ACT IN AERS
2.0000 | INHALATION_SPRAY | Freq: Once | RESPIRATORY_TRACT | Status: AC
  Administered 2020-05-28: 2 via RESPIRATORY_TRACT
  Filled 2020-05-28: qty 6.7

## 2020-05-28 MED ORDER — ACETAMINOPHEN 325 MG PO TABS
650.0000 mg | ORAL_TABLET | Freq: Once | ORAL | Status: AC
  Administered 2020-05-28: 650 mg via ORAL
  Filled 2020-05-28: qty 2

## 2020-05-28 MED ORDER — SODIUM CHLORIDE 0.9 % IV SOLN
100.0000 mg | Freq: Every day | INTRAVENOUS | Status: AC
  Administered 2020-05-29 – 2020-06-01 (×4): 100 mg via INTRAVENOUS
  Filled 2020-05-28 (×4): qty 20

## 2020-05-28 MED ORDER — ENOXAPARIN SODIUM 60 MG/0.6ML ~~LOC~~ SOLN
0.5000 mg/kg | SUBCUTANEOUS | Status: DC
  Administered 2020-05-28 – 2020-05-30 (×3): 55 mg via SUBCUTANEOUS
  Filled 2020-05-28: qty 0.55
  Filled 2020-05-28: qty 0.6
  Filled 2020-05-28 (×2): qty 0.55

## 2020-05-28 MED ORDER — DEXAMETHASONE SODIUM PHOSPHATE 10 MG/ML IJ SOLN
6.0000 mg | Freq: Once | INTRAMUSCULAR | Status: AC
  Administered 2020-05-28: 6 mg via INTRAVENOUS
  Filled 2020-05-28: qty 1

## 2020-05-28 MED ORDER — INSULIN ASPART 100 UNIT/ML ~~LOC~~ SOLN
0.0000 [IU] | Freq: Three times a day (TID) | SUBCUTANEOUS | Status: DC
  Administered 2020-05-29: 5 [IU] via SUBCUTANEOUS
  Administered 2020-05-29 (×2): 2 [IU] via SUBCUTANEOUS
  Administered 2020-05-30: 7 [IU] via SUBCUTANEOUS
  Administered 2020-05-30: 5 [IU] via SUBCUTANEOUS
  Administered 2020-05-30: 3 [IU] via SUBCUTANEOUS
  Administered 2020-05-31: 5 [IU] via SUBCUTANEOUS
  Administered 2020-05-31: 9 [IU] via SUBCUTANEOUS
  Administered 2020-05-31 – 2020-06-01 (×2): 3 [IU] via SUBCUTANEOUS
  Administered 2020-06-01: 7 [IU] via SUBCUTANEOUS
  Administered 2020-06-01 – 2020-06-02 (×3): 5 [IU] via SUBCUTANEOUS
  Administered 2020-06-02: 7 [IU] via SUBCUTANEOUS
  Administered 2020-06-03 (×2): 2 [IU] via SUBCUTANEOUS
  Administered 2020-06-03: 5 [IU] via SUBCUTANEOUS
  Administered 2020-06-04: 3 [IU] via SUBCUTANEOUS
  Administered 2020-06-04: 1 [IU] via SUBCUTANEOUS
  Administered 2020-06-04: 2 [IU] via SUBCUTANEOUS
  Administered 2020-06-05: 1 [IU] via SUBCUTANEOUS
  Filled 2020-05-28: qty 0.09

## 2020-05-28 MED ORDER — DEXAMETHASONE SODIUM PHOSPHATE 10 MG/ML IJ SOLN: 6.0000 mg | INTRAMUSCULAR | Status: DC

## 2020-05-28 MED ORDER — SODIUM CHLORIDE 0.9 % IV SOLN
200.0000 mg | Freq: Once | INTRAVENOUS | Status: AC
  Administered 2020-05-28: 200 mg via INTRAVENOUS
  Filled 2020-05-28: qty 200

## 2020-05-28 MED ORDER — BARICITINIB 2 MG PO TABS
4.0000 mg | ORAL_TABLET | Freq: Every day | ORAL | Status: DC
  Administered 2020-05-28 – 2020-06-05 (×9): 4 mg via ORAL
  Filled 2020-05-28 (×9): qty 2

## 2020-05-28 MED ORDER — ONDANSETRON HCL 4 MG/2ML IJ SOLN: 4.0000 mg | Freq: Four times a day (QID) | INTRAMUSCULAR | Status: DC | PRN

## 2020-05-28 MED ORDER — GUAIFENESIN-DM 100-10 MG/5ML PO SYRP
10.0000 mL | ORAL_SOLUTION | ORAL | Status: DC | PRN
  Administered 2020-05-28: 10 mL via ORAL
  Filled 2020-05-28: qty 10
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
## 2020-05-28 NOTE — ED Notes (Signed)
Harvie Heck, PA aware that pt O2 is 87% on 6L [place], per provider respiratory is coming to evaluate pt. Will continue to monitor. Pt is not in distress at this time.

## 2020-05-28 NOTE — Progress Notes (Signed)
Flutter Valves remain on backorder @ this time.
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
## 2020-05-29 ENCOUNTER — Encounter (HOSPITAL_COMMUNITY): Payer: Self-pay | Admitting: Family Medicine

## 2020-05-29 DIAGNOSIS — R6510 Systemic inflammatory response syndrome (SIRS) of non-infectious origin without acute organ dysfunction: Secondary | ICD-10-CM | POA: Diagnosis present

## 2020-05-29 LAB — COMPREHENSIVE METABOLIC PANEL
ALT: 52 U/L — ABNORMAL HIGH (ref 0–44)
AST: 41 U/L (ref 15–41)
Albumin: 2.8 g/dL — ABNORMAL LOW (ref 3.5–5.0)
Alkaline Phosphatase: 73 U/L (ref 38–126)
Anion gap: 11 (ref 5–15)
BUN: 23 mg/dL — ABNORMAL HIGH (ref 6–20)
CO2: 26 mmol/L (ref 22–32)
Calcium: 8.3 mg/dL — ABNORMAL LOW (ref 8.9–10.3)
Chloride: 102 mmol/L (ref 98–111)
Creatinine, Ser: 0.86 mg/dL (ref 0.61–1.24)
GFR calc Af Amer: >60 mL/min (ref >60)
GFR calc non Af Amer: >60 mL/min (ref >60)
Glucose, Bld: 187 mg/dL — ABNORMAL HIGH (ref 70–99)
Potassium: 4.2 mmol/L (ref 3.5–5.1)
Sodium: 139 mmol/L (ref 135–145)
Total Bilirubin: 0.8 mg/dL (ref 0.3–1.2)
Total Protein: 7.2 g/dL (ref 6.5–8.1)

## 2020-05-29 LAB — CBC WITH DIFFERENTIAL/PLATELET
Abs Immature Granulocytes: 0.4 10*3/uL — ABNORMAL HIGH (ref 0.00–0.07)
Band Neutrophils: 6 %
Basophils Absolute: 0 10*3/uL (ref 0.0–0.1)
Basophils Relative: 0 %
Eosinophils Absolute: 0 10*3/uL (ref 0.0–0.5)
Eosinophils Relative: 0 %
HCT: 38.2 % — ABNORMAL LOW (ref 39.0–52.0)
Hemoglobin: 12.6 g/dL — ABNORMAL LOW (ref 13.0–17.0)
Lymphocytes Relative: 17 %
Lymphs Abs: 1.2 10*3/uL (ref 0.7–4.0)
MCH: 29.5 pg (ref 26.0–34.0)
MCHC: 33 g/dL (ref 30.0–36.0)
MCV: 89.5 fL (ref 80.0–100.0)
Monocytes Absolute: 0.1 10*3/uL (ref 0.1–1.0)
Monocytes Relative: 2 %
Myelocytes: 5 %
Neutro Abs: 5.4 10*3/uL (ref 1.7–7.7)
Neutrophils Relative %: 70 %
Platelets: 274 10*3/uL (ref 150–400)
RBC: 4.27 MIL/uL (ref 4.22–5.81)
RDW: 12.8 % (ref 11.5–15.5)
WBC: 7.1 10*3/uL (ref 4.0–10.5)
nRBC: 0.4 % — ABNORMAL HIGH (ref 0.0–0.2)

## 2020-05-29 LAB — C-REACTIVE PROTEIN: CRP: 15.9 mg/dL — ABNORMAL HIGH (ref <1.0)

## 2020-05-29 LAB — HEMOGLOBIN A1C
Hgb A1c MFr Bld: 6.4 % — ABNORMAL HIGH (ref 4.8–5.6)
Mean Plasma Glucose: 136.98 mg/dL

## 2020-05-29 LAB — CBG MONITORING, ED
Glucose-Capillary: 156 mg/dL — ABNORMAL HIGH (ref 70–99)
Glucose-Capillary: 196 mg/dL — ABNORMAL HIGH (ref 70–99)
Glucose-Capillary: 253 mg/dL — ABNORMAL HIGH (ref 70–99)
Glucose-Capillary: 281 mg/dL — ABNORMAL HIGH (ref 70–99)

## 2020-05-29 LAB — HIV ANTIBODY (ROUTINE TESTING W REFLEX): HIV Screen 4th Generation wRfx: NONREACTIVE

## 2020-05-29 MED ORDER — IPRATROPIUM-ALBUTEROL 20-100 MCG/ACT IN AERS
1.0000 | INHALATION_SPRAY | Freq: Four times a day (QID) | RESPIRATORY_TRACT | Status: DC
  Administered 2020-05-29 – 2020-06-05 (×28): 1 via RESPIRATORY_TRACT
  Filled 2020-05-29: qty 4

## 2020-05-29 MED ORDER — METHYLPREDNISOLONE SODIUM SUCC 125 MG IJ SOLR
60.0000 mg | Freq: Two times a day (BID) | INTRAMUSCULAR | Status: DC
  Administered 2020-05-29 – 2020-06-03 (×11): 60 mg via INTRAVENOUS
  Filled 2020-05-29 (×11): qty 2

## 2020-05-29 MED ORDER — GUAIFENESIN-CODEINE 100-10 MG/5ML PO SOLN: 10.0000 mL | Freq: Every evening | ORAL | Status: DC | PRN

## 2020-05-29 MED ORDER — ACETAMINOPHEN 325 MG PO TABS
650.0000 mg | ORAL_TABLET | Freq: Four times a day (QID) | ORAL | Status: DC | PRN
  Administered 2020-05-29 – 2020-05-30 (×2): 650 mg via ORAL
  Filled 2020-05-29 (×2): qty 2

## 2020-05-29 NOTE — ED Notes (Signed)
Assumed care of patient at this time, nad noted, sr up x2, bed locked and low, call bell w/I reach.  Will continue to monitor. ° °

## 2020-05-29 NOTE — Progress Notes (Signed)
PROGRESS NOTE    Sean Reynolds  WUJ:[id number] DOB: July 21, 1971 DOA: 05/28/2020 PCP: [facility]     Brief Narrative:  Sean Reynolds is a 49 y.o. WM PMHx no significant medical history   who presents with concerns of increasing shortness of breath.  About 10 days ago he was at a pool party and the following day felt like his face was burning and had increasing fatigue and lethargy.  Had chills but no objective fever.  Had frequent productive cough associated with nausea and chest discomfort. Has loss of taste and smell. Denies any vomiting or diarrhea.  He then presented to urgent care about 4 days ago and was found to be Covid positive.  Also later realized that his sister and brother-in-law who were at the pool party also were positive for Covid.  Today he began to feel more shortness of breath and felt like he almost passed out on his way into the ER.  Patient denies any tobacco or illicit drug use.  Has occasional alcohol use.  ED Course: He was febrile up to 100.6, tachycardic and tachypneic and was initially found to be at 50% oxygen saturation on room air.  Initially placed on nonrebreather and was able to titrate back down to 6 L. CBC unremarkable.  Lactic acid of 1.8.  CMP with mild elevated AST and ALT.  Procalcitonin ambiguous at 0.29.  CRP of 20.7.   Subjective: T-max overnight 38.1 C, A/O x4, negative CP, negative abdominal pain, negative nausea, negative vomiting   Assessment & Plan: Covid vaccination;   Active Problems:   Pneumonia due to COVID-19 virus   Acute on chronic respiratory failure with hypoxia (HCC)   Hyperglycemia   Transaminitis   SIRS (systemic inflammatory response syndrome) (HCC)  SIRS -On admission patient required higher for SIRS HR> 90, RR> 20  Acute respiratory failure with hypoxia/Covid pneumonia COVID-19 Labs  Recent Labs    05/28/20 1427 05/29/20 0500  DDIMER 3.64*  --   FERRITIN 4,340*  --   LDH 493*  --   CRP  20.7* 15.9*    Lab Results  Component Value Date   SARSCOV2NAA POSITIVE (A) 05/28/2020  -Baricitinib per pharmacy protocol x14 days -Solu-Medrol 60 mg BID x10 days -Remdesivir per pharmacy protocol x5 days -Respimat QID -Incentive spirometry -Flutter valve (on back order) -Titrate O2 to maintain SPO2> 88%   Prediabetes -9/14 hemoglobin A1c= 6.4 this meets criteria for prediabetes. -Sensitive SSI -Lipid panel pending   Mild transaminitis AST of 46 and ALT of 52 Secondary to Covid infection.  Continue to monitor daily.  DVT prophylaxis: Lovenox Code Status: Full Family Communication:  Status is: Inpatient    Dispo: The patient is from: Home              Anticipated d/c is to: Home              Anticipated d/c date is: 9/20              Patient currently unstable      Consultants:    Procedures/Significant Events:    I have personally reviewed and interpreted all radiology studies and my findings are as above.  VENTILATOR SETTINGS: HFNC 9/15 Flow; 13 L/min SPO2 83%   Cultures   Antimicrobials: Anti-infectives (From admission, onward)   Start     Dose/Rate Route Frequency Ordered Stop   05/29/20 1000  remdesivir 100 mg in sodium chloride 0.9 % 100 mL IVPB       "  Followed by" Linked Group Details   100 mg 200 mL/hr over 30 Minutes Intravenous Daily 05/28/20 1708 06/02/20 0959   05/28/20 1800  remdesivir 200 mg in sodium chloride 0.9% 250 mL IVPB       "Followed by" Linked Group Details   200 mg 580 mL/hr over 30 Minutes Intravenous Once 05/28/20 1708 05/28/20 2055       Devices    LINES / TUBES:      Continuous Infusions: . remdesivir 100 mg in NS 100 mL 100 mg (05/29/20 1028)     Objective: Vitals:   05/29/20 0600 05/29/20 0700 05/29/20 1106 05/29/20 1136  BP: 131/81 (!) 143/95 139/90   Pulse: 80 79 76 76  Resp: (!) 34 (!) 31 (!) 33 (!) 25  Temp:      TempSrc:      SpO2: 90% (!) 87% (!) 88% 91%  Weight:      Height:         Intake/Output Summary (Last 24 hours) at 05/29/2020 1201 Last data filed at 05/29/2020 0013 Gross per 24 hour  Intake 250 ml  Output 600 ml  Net -350 ml   Filed Weights   05/28/20 2100  Weight: 108.9 kg    Examination:  General: A/O x4, positive acute respiratory distress Eyes: negative scleral hemorrhage, negative anisocoria, negative icterus ENT: Negative Runny nose, negative gingival bleeding, Neck:  Negative scars, masses, torticollis, lymphadenopathy, JVD Lungs: tachypneic poor air movement bilaterally without wheezes or crackles Cardiovascular: Regular rate and rhythm without murmur gallop or rub normal S1 and S2 Abdomen: negative abdominal pain, nondistended, positive soft, bowel sounds, no rebound, no ascites, no appreciable mass Extremities: No significant cyanosis, clubbing, or edema bilateral lower extremities Skin: Negative rashes, lesions, ulcers Psychiatric:  Negative depression, negative anxiety, negative fatigue, negative mania  Central nervous system:  Cranial nerves II through XII intact, tongue/uvula midline, all extremities muscle strength 5/5, sensation intact throughout, negative dysarthria, negative expressive aphasia, negative receptive aphasia.  .     Data Reviewed: Care during the described time interval was provided by me .  I have reviewed this patient's available data, including medical history, events of note, physical examination, and all test results as part of my evaluation.  CBC: Recent Labs  Lab 05/28/20 1427 05/29/20 0500  WBC 9.2 7.1  NEUTROABS 6.7 5.4  HGB 13.9 12.6*  HCT 41.5 38.2*  MCV 87.7 89.5  PLT 268 274   Basic Metabolic Panel: Recent Labs  Lab 05/28/20 1427 05/29/20 0500  NA 139 139  K 3.5 4.2  CL 99 102  CO2 23 26  GLUCOSE 172* 187*  BUN 23* 23*  CREATININE 0.73 0.86  CALCIUM 8.4* 8.3*   GFR: Estimated Creatinine Clearance: 126.4 mL/min (by C-G formula based on SCr of 0.86 mg/dL). Liver Function  Tests: Recent Labs  Lab 05/28/20 1427 05/29/20 0500  AST 46* 41  ALT 52* 52*  ALKPHOS 82 73  BILITOT 0.7 0.8  PROT 7.7 7.2  ALBUMIN 3.0* 2.8*   No results for input(s): LIPASE, AMYLASE in the last 168 hours. No results for input(s): AMMONIA in the last 168 hours. Coagulation Profile: No results for input(s): INR, PROTIME in the last 168 hours. Cardiac Enzymes: No results for input(s): CKTOTAL, CKMB, CKMBINDEX, TROPONINI in the last 168 hours. BNP (last 3 results) No results for input(s): PROBNP in the last 8760 hours. HbA1C: Recent Labs    05/28/20 1950  HGBA1C 6.4*   CBG: Recent Labs  Lab 05/28/20 2147  05/29/20 0831 05/29/20 1147  GLUCAP 342* 156* 196*   Lipid Profile: Recent Labs    05/28/20 1427  Sean 264*   Thyroid Function Tests: No results for input(s): TSH, T4TOTAL, FREET4, T3FREE, THYROIDAB in the last 72 hours. Anemia Panel: Recent Labs    05/28/20 1427  FERRITIN 4,340*   Sepsis Labs: Recent Labs  Lab 05/28/20 1427 05/28/20 1837  PROCALCITON 0.29  --   LATICACIDVEN 1.8 1.6    Recent Results (from the past 240 hour(s))  SARS Coronavirus 2 by RT PCR (hospital order, performed in [facility] hospital lab) Nasopharyngeal Nasopharyngeal Swab     Status: Abnormal   Collection Time: 05/28/20  3:59 PM   Specimen: Nasopharyngeal Swab  Result Value Ref Range Status   SARS Coronavirus 2 POSITIVE (A) NEGATIVE Final    Comment: RESULT CALLED TO, READ BACK BY AND VERIFIED WITH: WEST,S. RN @1747  05/28/20 BILLINGSLEY,L (NOTE) SARS-CoV-2 target nucleic acids are DETECTED  SARS-CoV-2 RNA is generally detectable in upper respiratory specimens  during the acute phase of infection.  Positive results are indicative  of the presence of the identified virus, but do not rule out bacterial infection or co-infection with other pathogens not detected by the test.  Clinical correlation with patient history and  other diagnostic information is necessary to determine  patient infection status.  The expected result is negative.  Fact Sheet for Patients:   05/30/20   Fact Sheet for Healthcare Providers:   BoilerBrush.com.cy    This test is not yet approved or cleared by the https://pope.com/ FDA and  has been authorized for detection and/or diagnosis of SARS-CoV-2 by FDA under an Emergency Use Authorization (EUA).  This EUA will remain in effect (meaning thi s test can be used) for the duration of  the COVID-19 declaration under Section 564(b)(1) of the Act, 21 U.S.C. section 360-bbb-3(b)(1), unless the authorization is terminated or revoked sooner.  Performed at [facility], [address] [place], [place]          Radiology Studies: DG Chest Port 1 View  Result Date: 05/28/2020 CLINICAL DATA:  COVID-19 positive EXAM: PORTABLE CHEST 1 VIEW COMPARISON:  Radiograph 08/28/2011 FINDINGS: Multifocal heterogeneous interstitial and airspace opacities are present throughout both lungs without pneumothorax or effusion. The cardiomediastinal contours are unremarkable. No acute osseous or soft tissue abnormality. Telemetry leads overlie the chest. IMPRESSION: Multifocal heterogeneous interstitial and airspace opacities throughout both lungs, compatible with multifocal pneumonia/atypical infection in the setting of COVID-19 positivity. Electronically Signed   By: 08/30/2011 M.D.   On: 05/28/2020 17:14        Scheduled Meds: . baricitinib  4 mg Oral Daily  . dexamethasone (DECADRON) injection  6 mg Intravenous Q24H  . enoxaparin (LOVENOX) injection  0.5 mg/kg Subcutaneous Q24H  . insulin aspart  0-9 Units Subcutaneous TID WC   Continuous Infusions: . remdesivir 100 mg in NS 100 mL 100 mg (05/29/20 1028)     LOS: 1 day    Time spent:40 min    Pernella Ackerley, 05/31/20, MD [facility] Pager [phone]  If 7PM-7AM, please contact  night-coverage [email or web address] Password [facility] 05/29/2020, 12:01 PM

## 2020-05-30 LAB — LIPID PANEL
Cholesterol: 226 mg/dL — ABNORMAL HIGH (ref 0–200)
HDL: 19 mg/dL — ABNORMAL LOW (ref >40)
LDL Cholesterol: 180 mg/dL — ABNORMAL HIGH (ref 0–99)
Total CHOL/HDL Ratio: 11.9 RATIO
Triglycerides: 134 mg/dL (ref <150)
VLDL: 27 mg/dL (ref 0–40)

## 2020-05-30 LAB — CBC WITH DIFFERENTIAL/PLATELET
Abs Immature Granulocytes: 0.6 10*3/uL — ABNORMAL HIGH (ref 0.00–0.07)
Band Neutrophils: 1 %
Basophils Absolute: 0 10*3/uL (ref 0.0–0.1)
Basophils Relative: 0 %
Eosinophils Absolute: 0 10*3/uL (ref 0.0–0.5)
Eosinophils Relative: 0 %
HCT: 39.1 % (ref 39.0–52.0)
Hemoglobin: 12.8 g/dL — ABNORMAL LOW (ref 13.0–17.0)
Lymphocytes Relative: 6 %
Lymphs Abs: 0.6 10*3/uL — ABNORMAL LOW (ref 0.7–4.0)
MCH: 29.3 pg (ref 26.0–34.0)
MCHC: 32.7 g/dL (ref 30.0–36.0)
MCV: 89.5 fL (ref 80.0–100.0)
Metamyelocytes Relative: 2 %
Monocytes Absolute: 0.1 10*3/uL (ref 0.1–1.0)
Monocytes Relative: 1 %
Myelocytes: 4 %
Neutro Abs: 9.3 10*3/uL — ABNORMAL HIGH (ref 1.7–7.7)
Neutrophils Relative %: 86 %
Platelets: 261 10*3/uL (ref 150–400)
RBC: 4.37 MIL/uL (ref 4.22–5.81)
RDW: 12.5 % (ref 11.5–15.5)
WBC: 10.7 10*3/uL — ABNORMAL HIGH (ref 4.0–10.5)
nRBC: 0 % (ref 0.0–0.2)

## 2020-05-30 LAB — COMPREHENSIVE METABOLIC PANEL
ALT: 58 U/L — ABNORMAL HIGH (ref 0–44)
AST: 34 U/L (ref 15–41)
Albumin: 2.9 g/dL — ABNORMAL LOW (ref 3.5–5.0)
Alkaline Phosphatase: 75 U/L (ref 38–126)
Anion gap: 12 (ref 5–15)
BUN: 26 mg/dL — ABNORMAL HIGH (ref 6–20)
CO2: 25 mmol/L (ref 22–32)
Calcium: 8.5 mg/dL — ABNORMAL LOW (ref 8.9–10.3)
Chloride: 100 mmol/L (ref 98–111)
Creatinine, Ser: 0.81 mg/dL (ref 0.61–1.24)
GFR calc Af Amer: >60 mL/min (ref >60)
GFR calc non Af Amer: >60 mL/min (ref >60)
Glucose, Bld: 253 mg/dL — ABNORMAL HIGH (ref 70–99)
Potassium: 4.3 mmol/L (ref 3.5–5.1)
Sodium: 137 mmol/L (ref 135–145)
Total Bilirubin: 0.7 mg/dL (ref 0.3–1.2)
Total Protein: 7.3 g/dL (ref 6.5–8.1)

## 2020-05-30 LAB — CBG MONITORING, ED
Glucose-Capillary: 235 mg/dL — ABNORMAL HIGH (ref 70–99)
Glucose-Capillary: 308 mg/dL — ABNORMAL HIGH (ref 70–99)
Glucose-Capillary: 275 mg/dL — ABNORMAL HIGH (ref 70–99)

## 2020-05-30 LAB — FERRITIN: Ferritin: 2324 ng/mL — ABNORMAL HIGH (ref 24–336)

## 2020-05-30 LAB — D-DIMER, QUANTITATIVE: D-Dimer, Quant: >20 ug/mL-FEU — ABNORMAL HIGH (ref 0.00–0.50)

## 2020-05-30 LAB — MAGNESIUM: Magnesium: 2.5 mg/dL — ABNORMAL HIGH (ref 1.7–2.4)

## 2020-05-30 LAB — LACTATE DEHYDROGENASE: LDH: 396 U/L — ABNORMAL HIGH (ref 98–192)

## 2020-05-30 LAB — GLUCOSE, CAPILLARY: Glucose-Capillary: 226 mg/dL — ABNORMAL HIGH (ref 70–99)

## 2020-05-30 LAB — PHOSPHORUS: Phosphorus: 3.4 mg/dL (ref 2.5–4.6)

## 2020-05-30 LAB — C-REACTIVE PROTEIN: CRP: 8.1 mg/dL — ABNORMAL HIGH (ref <1.0)

## 2020-05-30 MED ORDER — SALINE SPRAY 0.65 % NA SOLN
1.0000 | NASAL | Status: DC | PRN
  Administered 2020-05-30 – 2020-05-31 (×2): 1 via NASAL
  Filled 2020-05-30: qty 44

## 2020-05-30 MED ORDER — LISINOPRIL 5 MG PO TABS
2.5000 mg | ORAL_TABLET | Freq: Every day | ORAL | Status: DC
  Administered 2020-05-30 – 2020-06-05 (×7): 2.5 mg via ORAL
  Filled 2020-05-30 (×7): qty 1

## 2020-05-30 NOTE — Progress Notes (Signed)
PROGRESS NOTE    Sean Reynolds  ONG:[id number] DOB: 08-06-1971 DOA: 05/28/2020 PCP: [facility]     Brief Narrative:  Sean Reynolds is a 49 y.o. WM PMHx no significant medical history   who presents with concerns of increasing shortness of breath.  About 10 days ago he was at a pool party and the following day felt like his face was burning and had increasing fatigue and lethargy.  Had chills but no objective fever.  Had frequent productive cough associated with nausea and chest discomfort. Has loss of taste and smell. Denies any vomiting or diarrhea.  He then presented to urgent care about 4 days ago and was found to be Covid positive.  Also later realized that his sister and brother-in-law who were at the pool party also were positive for Covid.  Today he began to feel more shortness of breath and felt like he almost passed out on his way into the ER.  Patient denies any tobacco or illicit drug use.  Has occasional alcohol use.  ED Course: He was febrile up to 100.6, tachycardic and tachypneic and was initially found to be at 50% oxygen saturation on room air.  Initially placed on nonrebreather and was able to titrate back down to 6 L. CBC unremarkable.  Lactic acid of 1.8.  CMP with mild elevated AST and ALT.  Procalcitonin ambiguous at 0.29.  CRP of 20.7.   Subjective: 9/16 afebrile overnight A/O x4, negative CP, negative abdominal pain, negative nausea, negative vomiting.  Positive S OB   Assessment & Plan: Covid vaccination;   Active Problems:   Pneumonia due to COVID-19 virus   Acute on chronic respiratory failure with hypoxia (HCC)   Hyperglycemia   Transaminitis   SIRS (systemic inflammatory response syndrome) (HCC)  SIRS -On admission patient required higher for SIRS HR> 90, RR> 20  Acute respiratory failure with hypoxia/Covid pneumonia COVID-19 Labs  Recent Labs    05/28/20 1427 05/29/20 0500 05/30/20 0235  DDIMER 3.64*  --  >20.00*   FERRITIN 4,340*  --  2,324*  LDH 493*  --  396*  CRP 20.7* 15.9* 8.1*    Lab Results  Component Value Date   SARSCOV2NAA POSITIVE (A) 05/28/2020  -Baricitinib per pharmacy protocol x14 days -Solu-Medrol 60 mg BID x10 days -Remdesivir per pharmacy protocol x5 days -Respimat QID -Incentive spirometry -Flutter valve (on back order) -Titrate O2 to maintain SPO2> 88%   Prediabetes -9/14 hemoglobin A1c= 6.4 this meets criteria for prediabetes. -Sensitive SSI -Lipid panel pending  Essential HTN -9/16 lisinopril 2.5 mg daily   Mild transaminitis AST of 46 and ALT of 52 Secondary to Covid infection.  Continue to monitor daily.  DVT prophylaxis: Lovenox Code Status: Full Family Communication:  Status is: Inpatient    Dispo: The patient is from: Home              Anticipated d/c is to: Home              Anticipated d/c date is: 9/20              Patient currently unstable      Consultants:    Procedures/Significant Events:    I have personally reviewed and interpreted all radiology studies and my findings are as above.  VENTILATOR SETTINGS: HFNC 9/16 Flow; 14 L/min SPO2 90%   Cultures   Antimicrobials: Anti-infectives (From admission, onward)   Start     Ordered Stop   05/29/20 1000  remdesivir  100 mg in sodium chloride 0.9 % 100 mL IVPB       "Followed by" Linked Group Details   05/28/20 1708 06/02/20 0959   05/28/20 1800  remdesivir 200 mg in sodium chloride 0.9% 250 mL IVPB       "Followed by" Linked Group Details   05/28/20 1708 05/28/20 2055       Devices    LINES / TUBES:      Continuous Infusions: . remdesivir 100 mg in NS 100 mL 100 mg (05/30/20 0801)     Objective: Vitals:   05/30/20 0430 05/30/20 0500 05/30/20 0700 05/30/20 0730  BP: 135/72 (!) 158/97 (!) 160/129 (!) 153/109  Pulse: 70 69 71 66  Resp: (!) 32  (!) 33 14  Temp:      TempSrc:      SpO2: (!) 82% (!) 85% (!) 88% (!) 88%  Weight:      Height:         Intake/Output Summary (Last 24 hours) at 05/30/2020 3662 Last data filed at 05/30/2020 0214 Gross per 24 hour  Intake --  Output 550 ml  Net -550 ml   Filed Weights   05/28/20 2100  Weight: 108.9 kg    Examination:  General: A/O x4, positive acute respiratory distress Eyes: negative scleral hemorrhage, negative anisocoria, negative icterus ENT: Negative Runny nose, negative gingival bleeding, Neck:  Negative scars, masses, torticollis, lymphadenopathy, JVD Lungs: tachypneic poor air movement bilaterally without wheezes or crackles Cardiovascular: Regular rate and rhythm without murmur gallop or rub normal S1 and S2 Abdomen: negative abdominal pain, nondistended, positive soft, bowel sounds, no rebound, no ascites, no appreciable mass Extremities: No significant cyanosis, clubbing, or edema bilateral lower extremities Skin: Negative rashes, lesions, ulcers Psychiatric:  Negative depression, negative anxiety, negative fatigue, negative mania  Central nervous system:  Cranial nerves II through XII intact, tongue/uvula midline, all extremities muscle strength 5/5, sensation intact throughout, negative dysarthria, negative expressive aphasia, negative receptive aphasia.  .     Data Reviewed: Care during the described time interval was provided by me .  I have reviewed this patient's available data, including medical history, events of note, physical examination, and all test results as part of my evaluation.  CBC: Recent Labs  Lab 05/28/20 1427 05/29/20 0500 05/30/20 0235  WBC 9.2 7.1 10.7*  NEUTROABS 6.7 5.4 9.3*  HGB 13.9 12.6* 12.8*  HCT 41.5 38.2* 39.1  MCV 87.7 89.5 89.5  PLT 268 274 261   Basic Metabolic Panel: Recent Labs  Lab 05/28/20 1427 05/29/20 0500 05/30/20 0235  NA 139 139 137  K 3.5 4.2 4.3  CL 99 102 100  CO2 23 26 25   GLUCOSE 172* 187* 253*  BUN 23* 23* 26*  CREATININE 0.73 0.86 0.81  CALCIUM 8.4* 8.3* 8.5*  MG  --   --  2.5*  PHOS  --   --   3.4   GFR: Estimated Creatinine Clearance: 134.2 mL/min (by C-G formula based on SCr of 0.81 mg/dL). Liver Function Tests: Recent Labs  Lab 05/28/20 1427 05/29/20 0500 05/30/20 0235  AST 46* 41 34  ALT 52* 52* 58*  ALKPHOS 82 73 75  BILITOT 0.7 0.8 0.7  PROT 7.7 7.2 7.3  ALBUMIN 3.0* 2.8* 2.9*   No results for input(s): LIPASE, AMYLASE in the last 168 hours. No results for input(s): AMMONIA in the last 168 hours. Coagulation Profile: No results for input(s): INR, PROTIME in the last 168 hours. Cardiac Enzymes: No results for input(s):  CKTOTAL, CKMB, CKMBINDEX, TROPONINI in the last 168 hours. BNP (last 3 results) No results for input(s): PROBNP in the last 8760 hours. HbA1C: Recent Labs    05/28/20 1950  HGBA1C 6.4*   CBG: Recent Labs  Lab 05/29/20 0831 05/29/20 1147 05/29/20 1657 05/29/20 2002 05/30/20 0736  GLUCAP 156* 196* 253* 281* 235*   Lipid Profile: Recent Labs    05/28/20 1427 05/30/20 0235  CHOL  --  226*  HDL  --  19*  LDLCALC  --  180*  TRIG 264* 134  CHOLHDL  --  11.9   Thyroid Function Tests: No results for input(s): TSH, T4TOTAL, FREET4, T3FREE, THYROIDAB in the last 72 hours. Anemia Panel: Recent Labs    05/28/20 1427 05/30/20 0235  FERRITIN 4,340* 2,324*   Sepsis Labs: Recent Labs  Lab 05/28/20 1427 05/28/20 1837  PROCALCITON 0.29  --   LATICACIDVEN 1.8 1.6    Recent Results (from the past 240 hour(s))  Blood Culture (routine x 2)     Status: None (Preliminary result)   Collection Time: 05/28/20  2:25 PM   Specimen: BLOOD LEFT HAND  Result Value Ref Range Status   Specimen Description   Final    BLOOD LEFT HAND Performed at [facility], [address] [place], [place] [address]    Special Requests   Final    BOTTLES DRAWN AEROBIC AND ANAEROBIC Blood Culture results may not be optimal due to an inadequate volume of blood received in culture bottles Performed at [facility], [address] [place], [place] [address]    Culture   Final    NO GROWTH < 24 HOURS Performed at [facility], [address] [place], [place] [address]    Report Status PENDING  Incomplete  Blood Culture (routine x 2)     Status: None (Preliminary result)   Collection Time: 05/28/20  2:27 PM   Specimen: BLOOD  Result Value Ref Range Status   Specimen Description   Final    BLOOD RIGHT ANTECUBITAL Performed at [facility], [address] [place], [place] [address]    Special Requests   Final    BOTTLES DRAWN AEROBIC AND ANAEROBIC Blood Culture adequate volume Performed at [facility], [address] [place], [place] [address]    Culture   Final    NO GROWTH < 24 HOURS Performed at [facility], [address] [place], [place] [address]    Report Status PENDING  Incomplete  SARS Coronavirus 2 by RT PCR (hospital order, performed in [facility] hospital lab) Nasopharyngeal Nasopharyngeal Swab     Status: Abnormal   Collection Time: 05/28/20  3:59 PM   Specimen: Nasopharyngeal Swab  Result Value Ref Range Status   SARS Coronavirus 2 POSITIVE (A) NEGATIVE Final    Comment: RESULT CALLED TO, READ BACK BY AND VERIFIED WITH: WEST,S. RN @1747  05/28/20 BILLINGSLEY,L (NOTE) SARS-CoV-2 target nucleic acids are DETECTED  SARS-CoV-2 RNA is generally detectable in upper respiratory specimens  during the acute phase of infection.  Positive results are indicative  of the presence of the identified virus, but do not rule out bacterial infection or co-infection with other pathogens not detected by the test.  Clinical correlation with patient history and  other diagnostic information is necessary to determine patient infection status.  The expected result is negative.  Fact Sheet for Patients:   BoilerBrush.com.cyhttps://www.fda.gov/media/136312/download   Fact Sheet for Healthcare Providers:  https://pope.com/    This test is not  yet approved or cleared by the [place] and  has been authorized for detection and/or diagnosis of SARS-CoV-2 by FDA under an Emergency Use Authorization (EUA).  This EUA will remain in effect (meaning thi s test can be used) for the duration of  the COVID-19 declaration under Section 564(b)(1) of the Act, 21 U.S.C. section 360-bbb-3(b)(1), unless the authorization is terminated or revoked sooner.  Performed at [facility], [address], [place], [place] [address]          Radiology Studies: DG Chest Port 1 View  Result Date: 05/28/2020 CLINICAL DATA:  COVID-19 positive EXAM: PORTABLE CHEST 1 VIEW COMPARISON:  Radiograph 08/28/2011 FINDINGS: Multifocal heterogeneous interstitial and airspace opacities are present throughout both lungs without pneumothorax or effusion. The cardiomediastinal contours are unremarkable. No acute osseous or soft tissue abnormality. Telemetry leads overlie the chest. IMPRESSION: Multifocal heterogeneous interstitial and airspace opacities throughout both lungs, compatible with multifocal pneumonia/atypical infection in the setting of COVID-19 positivity. Electronically Signed   By: Kreg Shropshire M.D.   On: 05/28/2020 17:14        Scheduled Meds: . baricitinib  4 mg Oral Daily  . enoxaparin (LOVENOX) injection  0.5 mg/kg Subcutaneous Q24H  . insulin aspart  0-9 Units Subcutaneous TID WC  . Ipratropium-Albuterol  1 puff Inhalation Q6H  . methylPREDNISolone (SOLU-MEDROL) injection  60 mg Intravenous Q12H   Continuous Infusions: . remdesivir 100 mg in NS 100 mL 100 mg (05/30/20 0801)     LOS: 2 days    Time spent:40 min    Mada Sadik, Roselind Messier, MD [facility] Pager [phone]  If 7PM-7AM, please contact night-coverage [email or web address] Password [facility] 05/30/2020, 8:32 AM

## 2020-05-30 NOTE — ED Notes (Signed)
Patient is resting comfortably. 

## 2020-05-30 NOTE — Progress Notes (Signed)
Inpatient Diabetes Program Recommendations  AACE/ADA: New Consensus Statement on Inpatient Glycemic Control (2015)  Target Ranges:  Prepandial:   less than 140 mg/dL      Peak postprandial:   less than 180 mg/dL (1-2 hours)      Critically ill patients:  140 - 180 mg/dL   Lab Results  Component Value Date   GLUCAP 308 (H) 05/30/2020   HGBA1C 6.4 (H) 05/28/2020    Review of Glycemic Control Results for Sean Reynolds, Sean Reynolds (MRN [id number]) as of 05/30/2020 11:48  Ref. Range 05/29/2020 16:57 05/29/2020 20:02 05/30/2020 07:36 05/30/2020 11:39  Glucose-Capillary Latest Ref Range: 70 - 99 mg/dL 947 (H) 096 (H) 283 (H) 308 (H)   COVID + Diabetes history: None A1c 6.4%  Current orders for Inpatient glycemic control:  Novolog 0-9 units tid  Solumedrol 60 mg Q12 hours  Inpatient Diabetes Program Recommendations:    Please utilize the COVID glycemic control order set:  -  Levemir 12 units bid -  Tradjenta 5 mg Daily -  Novolog 0-20 units tid + hs  Thanks,  Christena Deem RN, MSN, BC-ADM Inpatient Diabetes Coordinator Team Pager [phone] (8a-5p)

## 2020-05-30 NOTE — ED Notes (Signed)
Patient given meal tray and a pitcher of fresh ice water. 

## 2020-05-31 ENCOUNTER — Inpatient Hospital Stay (HOSPITAL_COMMUNITY): Payer: HRSA Program

## 2020-05-31 DIAGNOSIS — I2699 Other pulmonary embolism without acute cor pulmonale: Secondary | ICD-10-CM | POA: Diagnosis present

## 2020-05-31 DIAGNOSIS — R7989 Other specified abnormal findings of blood chemistry: Secondary | ICD-10-CM

## 2020-05-31 LAB — CBC WITH DIFFERENTIAL/PLATELET
Abs Immature Granulocytes: 1.46 10*3/uL — ABNORMAL HIGH (ref 0.00–0.07)
Basophils Absolute: 0 10*3/uL (ref 0.0–0.1)
Basophils Relative: 0 %
Eosinophils Absolute: 0 10*3/uL (ref 0.0–0.5)
Eosinophils Relative: 0 %
HCT: 41.3 % (ref 39.0–52.0)
Hemoglobin: 13.5 g/dL (ref 13.0–17.0)
Immature Granulocytes: 10 %
Lymphocytes Relative: 8 %
Lymphs Abs: 1.2 10*3/uL (ref 0.7–4.0)
MCH: 29.5 pg (ref 26.0–34.0)
MCHC: 32.7 g/dL (ref 30.0–36.0)
MCV: 90.2 fL (ref 80.0–100.0)
Monocytes Absolute: 0.6 10*3/uL (ref 0.1–1.0)
Monocytes Relative: 4 %
Neutro Abs: 11.3 10*3/uL — ABNORMAL HIGH (ref 1.7–7.7)
Neutrophils Relative %: 78 %
Platelets: 227 10*3/uL (ref 150–400)
RBC: 4.58 MIL/uL (ref 4.22–5.81)
RDW: 12.6 % (ref 11.5–15.5)
WBC: 14.5 10*3/uL — ABNORMAL HIGH (ref 4.0–10.5)
nRBC: 0 % (ref 0.0–0.2)

## 2020-05-31 LAB — GLUCOSE, CAPILLARY
Glucose-Capillary: 240 mg/dL — ABNORMAL HIGH (ref 70–99)
Glucose-Capillary: 358 mg/dL — ABNORMAL HIGH (ref 70–99)
Glucose-Capillary: 281 mg/dL — ABNORMAL HIGH (ref 70–99)
Glucose-Capillary: 236 mg/dL — ABNORMAL HIGH (ref 70–99)

## 2020-05-31 LAB — COMPREHENSIVE METABOLIC PANEL
ALT: 63 U/L — ABNORMAL HIGH (ref 0–44)
AST: 35 U/L (ref 15–41)
Albumin: 2.9 g/dL — ABNORMAL LOW (ref 3.5–5.0)
Alkaline Phosphatase: 66 U/L (ref 38–126)
Anion gap: 9 (ref 5–15)
BUN: 28 mg/dL — ABNORMAL HIGH (ref 6–20)
CO2: 28 mmol/L (ref 22–32)
Calcium: 8.5 mg/dL — ABNORMAL LOW (ref 8.9–10.3)
Chloride: 101 mmol/L (ref 98–111)
Creatinine, Ser: 0.92 mg/dL (ref 0.61–1.24)
GFR calc Af Amer: >60 mL/min (ref >60)
GFR calc non Af Amer: >60 mL/min (ref >60)
Glucose, Bld: 266 mg/dL — ABNORMAL HIGH (ref 70–99)
Potassium: 5 mmol/L (ref 3.5–5.1)
Sodium: 138 mmol/L (ref 135–145)
Total Bilirubin: 0.7 mg/dL (ref 0.3–1.2)
Total Protein: 7.1 g/dL (ref 6.5–8.1)

## 2020-05-31 LAB — FERRITIN: Ferritin: 1243 ng/mL — ABNORMAL HIGH (ref 24–336)

## 2020-05-31 LAB — MAGNESIUM: Magnesium: 2.2 mg/dL (ref 1.7–2.4)

## 2020-05-31 LAB — PHOSPHORUS: Phosphorus: 4.7 mg/dL — ABNORMAL HIGH (ref 2.5–4.6)

## 2020-05-31 LAB — D-DIMER, QUANTITATIVE: D-Dimer, Quant: 12.49 ug/mL-FEU — ABNORMAL HIGH (ref 0.00–0.50)

## 2020-05-31 LAB — LACTATE DEHYDROGENASE: LDH: 336 U/L — ABNORMAL HIGH (ref 98–192)

## 2020-05-31 LAB — C-REACTIVE PROTEIN: CRP: 2.4 mg/dL — ABNORMAL HIGH (ref <1.0)

## 2020-05-31 MED ORDER — HYDRALAZINE HCL 20 MG/ML IJ SOLN: 5.0000 mg | INTRAMUSCULAR | Status: DC | PRN

## 2020-05-31 MED ORDER — IOHEXOL 350 MG/ML SOLN
100.0000 mL | Freq: Once | INTRAVENOUS | Status: AC | PRN
  Administered 2020-05-31: 100 mL via INTRAVENOUS

## 2020-05-31 MED ORDER — ENOXAPARIN SODIUM 120 MG/0.8ML ~~LOC~~ SOLN
110.0000 mg | Freq: Two times a day (BID) | SUBCUTANEOUS | Status: DC
  Administered 2020-06-01 – 2020-06-03 (×5): 110 mg via SUBCUTANEOUS
  Filled 2020-05-31 (×7): qty 0.74

## 2020-05-31 MED ORDER — ENOXAPARIN SODIUM 120 MG/0.8ML ~~LOC~~ SOLN
1.0000 mg/kg | Freq: Two times a day (BID) | SUBCUTANEOUS | Status: DC
  Administered 2020-05-31: 108 mg via SUBCUTANEOUS
  Filled 2020-05-31 (×4): qty 0.72

## 2020-05-31 NOTE — Progress Notes (Addendum)
Inpatient Diabetes Program Recommendations  AACE/ADA: New Consensus Statement on Inpatient Glycemic Control (2015)  Target Ranges:  Prepandial:   less than 140 mg/dL      Peak postprandial:   less than 180 mg/dL (1-2 hours)      Critically ill patients:  140 - 180 mg/dL   Lab Results  Component Value Date   GLUCAP 240 (H) 05/31/2020   HGBA1C 6.4 (H) 05/28/2020    Review of Glycemic Control Results for Sean Reynolds, Sean Reynolds (MRN [id number]) as of 05/31/2020 11:08  Ref. Range 05/30/2020 07:36 05/30/2020 11:39 05/30/2020 16:31 05/30/2020 21:25 05/31/2020 07:49  Glucose-Capillary Latest Ref Range: 70 - 99 mg/dL 332 (H) 951 (H) 884 (H) 226 (H) 240 (H)   COVID + Diabetes history: None A1c 6.4%  Current orders for Inpatient glycemic control:  Novolog 0-9 units tid  Solumedrol 60 mg Q12 hours  Inpatient Diabetes Program Recommendations:    Please utilize the COVID glycemic control order set:  -  Levemir 10-12 units bid -  Tradjenta 5 mg Daily -  Add Novolog 4 units tid meal coverage -  Novolog 0-15 units tid + hs  Thanks,  Christena Deem RN, MSN, BC-ADM Inpatient Diabetes Coordinator Team Pager [phone] (8a-5p)

## 2020-05-31 NOTE — Progress Notes (Signed)
PROGRESS NOTE    Sean Reynolds  PXT:[id number] DOB: May 19, 1971 DOA: 05/28/2020 PCP: [facility]     Brief Narrative:  Sean Reynolds is a 49 y.o. WM PMHx no significant medical history   who presents with concerns of increasing shortness of breath.  About 10 days ago he was at a pool party and the following day felt like his face was burning and had increasing fatigue and lethargy.  Had chills but no objective fever.  Had frequent productive cough associated with nausea and chest discomfort. Has loss of taste and smell. Denies any vomiting or diarrhea.  He then presented to urgent care about 4 days ago and was found to be Covid positive.  Also later realized that his sister and brother-in-law who were at the pool party also were positive for Covid.  Today he began to feel more shortness of breath and felt like he almost passed out on his way into the ER.  Patient denies any tobacco or illicit drug use.  Has occasional alcohol use.  ED Course: He was febrile up to 100.6, tachycardic and tachypneic and was initially found to be at 50% oxygen saturation on room air.  Initially placed on nonrebreather and was able to titrate back down to 6 L. CBC unremarkable.  Lactic acid of 1.8.  CMP with mild elevated AST and ALT.  Procalcitonin ambiguous at 0.29.  CRP of 20.7.   Subjective: 9/17 afebrile overnight A/O x4, positive S OB, negative CP, negative abdominal pain, negative nausea, negative vomiting.  States has plates and rods in right leg.   Assessment & Plan: Covid vaccination;   Active Problems:   Pneumonia due to COVID-19 virus   Acute on chronic respiratory failure with hypoxia (HCC)   Hyperglycemia   Transaminitis   SIRS (systemic inflammatory response syndrome) (HCC)   Pulmonary embolism (HCC)  SIRS -On admission patient required higher for SIRS HR> 90, RR> 20  Acute respiratory failure with hypoxia/Covid pneumonia COVID-19 Labs  Recent Labs     05/29/20 0500 05/30/20 0235 05/31/20 0354  DDIMER  --  >20.00* 12.49*  FERRITIN  --  2,324* 1,243*  LDH  --  396* 336*  CRP 15.9* 8.1* 2.4*    Lab Results  Component Value Date   SARSCOV2NAA POSITIVE (A) 05/28/2020  -Baricitinib per pharmacy protocol x14 days -Solu-Medrol 60 mg BID x10 days -Remdesivir per pharmacy protocol x5 days -Respimat QID -Incentive spirometry -Flutter valve (on back order) -Titrate O2 to maintain SPO2> 88%   Prediabetes -9/14 hemoglobin A1c= 6.4 this meets criteria for prediabetes. -Sensitive SSI  HLD -9/16 LDL= 180 -We will hold statin during Covid treatment but would start Lipitor upon discharge.  Essential HTN -9/17 increase lisinopril 5 mg daily -9/17 hydralazine IV prn  Mild transaminitis AST of 46 and ALT of 52 Secondary to Covid infection.  Continue to monitor daily.  Acute PE --Increaseing O2 demand PE/DVT? --CTA PE protocol pending --Lower extremity Doppler pending -9/17 echocardiogram pending    DVT prophylaxis: Lovenox Code Status: Full  Family Communication:  Status is: Inpatient    Dispo: The patient is from: Home              Anticipated d/c is to: Home              Anticipated d/c date is: 9/20              Patient currently unstable      Consultants:    Procedures/Significant Events:  9/17 CTA chest PE protocol;Possible segmental and subsegmental right lower lobe pulmonary embolus. Evaluation is markedly limited due to streak artifact and findings may just be artifactual. No right heart strain or pulmonary infarction. 2. Extensive multifocal pneumonia consistent with COVID-19 infection.   I have personally reviewed and interpreted all radiology studies and my findings are as above.  VENTILATOR SETTINGS: HFNC 9/17 Flow; 15L/min SPO2 96%   Cultures   Antimicrobials: Anti-infectives (From admission, onward)   Start     Ordered Stop   05/29/20 1000  remdesivir 100 mg in sodium chloride 0.9 %  100 mL IVPB       "Followed by" Linked Group Details   05/28/20 1708 06/02/20 0959   05/28/20 1800  remdesivir 200 mg in sodium chloride 0.9% 250 mL IVPB       "Followed by" Linked Group Details   05/28/20 1708 05/28/20 2055       Devices    LINES / TUBES:      Continuous Infusions: . remdesivir 100 mg in NS 100 mL Stopped (05/31/20 1010)     Objective: Vitals:   05/31/20 0920 05/31/20 1315 05/31/20 1736 05/31/20 1858  BP: 129/81 (!) 138/91 (!) 149/110 (!) 144/100  Pulse: 78 75 73 76  Resp: 20 20 20    Temp: 98.1 F (36.7 C) 98 F (36.7 C) 98.9 F (37.2 C)   TempSrc: Oral Oral Oral   SpO2: 94% 96% 96%   Weight:      Height:        Intake/Output Summary (Last 24 hours) at 05/31/2020 1919 Last data filed at 05/31/2020 1340 Gross per 24 hour  Intake 1298 ml  Output --  Net 1298 ml   Filed Weights   05/28/20 2100  Weight: 108.9 kg   Physical Exam:  General: A/O x4, positive acute respiratory distress Eyes: negative scleral hemorrhage, negative anisocoria, negative icterus ENT: Negative Runny nose, negative gingival bleeding, Neck:  Negative scars, masses, torticollis, lymphadenopathy, JVD Lungs: tachypneic, decreased/absent breath sounds right lung field, positive expiratory wheeze left lung field  Cardiovascular: Regular rate and rhythm without murmur gallop or rub normal S1 and S2 Abdomen: negative abdominal pain, nondistended, positive soft, bowel sounds, no rebound, no ascites, no appreciable mass Extremities: No significant cyanosis, clubbing, or edema bilateral lower extremities Skin: Negative rashes, lesions, ulcers Psychiatric:  Negative depression, negative anxiety, negative fatigue, negative mania  Central nervous system:  Cranial nerves II through XII intact, tongue/uvula midline, all extremities muscle strength 5/5, sensation intact throughout, negative dysarthria, negative expressive aphasia, negative receptive aphasia.  .     Data Reviewed:  Care during the described time interval was provided by me .  I have reviewed this patient's available data, including medical history, events of note, physical examination, and all test results as part of my evaluation.  CBC: Recent Labs  Lab 05/28/20 1427 05/29/20 0500 05/30/20 0235 05/31/20 0354  WBC 9.2 7.1 10.7* 14.5*  NEUTROABS 6.7 5.4 9.3* 11.3*  HGB 13.9 12.6* 12.8* 13.5  HCT 41.5 38.2* 39.1 41.3  MCV 87.7 89.5 89.5 90.2  PLT 268 274 261 227   Basic Metabolic Panel: Recent Labs  Lab 05/28/20 1427 05/29/20 0500 05/30/20 0235 05/31/20 0354  NA 139 139 137 138  K 3.5 4.2 4.3 5.0  CL 99 102 100 101  CO2 23 26 25 28   GLUCOSE 172* 187* 253* 266*  BUN 23* 23* 26* 28*  CREATININE 0.73 0.86 0.81 0.92  CALCIUM 8.4* 8.3* 8.5* 8.5*  MG  --   --  2.5* 2.2  PHOS  --   --  3.4 4.7*   GFR: Estimated Creatinine Clearance: 118.1 mL/min (by C-G formula based on SCr of 0.92 mg/dL). Liver Function Tests: Recent Labs  Lab 05/28/20 1427 05/29/20 0500 05/30/20 0235 05/31/20 0354  AST 46* 41 34 35  ALT 52* 52* 58* 63*  ALKPHOS 82 73 75 66  BILITOT 0.7 0.8 0.7 0.7  PROT 7.7 7.2 7.3 7.1  ALBUMIN 3.0* 2.8* 2.9* 2.9*   No results for input(s): LIPASE, AMYLASE in the last 168 hours. No results for input(s): AMMONIA in the last 168 hours. Coagulation Profile: No results for input(s): INR, PROTIME in the last 168 hours. Cardiac Enzymes: No results for input(s): CKTOTAL, CKMB, CKMBINDEX, TROPONINI in the last 168 hours. BNP (last 3 results) No results for input(s): PROBNP in the last 8760 hours. HbA1C: Recent Labs    05/28/20 1950  HGBA1C 6.4*   CBG: Recent Labs  Lab 05/30/20 1631 05/30/20 2125 05/31/20 0749 05/31/20 1131 05/31/20 1602  GLUCAP 275* 226* 240* 358* 281*   Lipid Profile: Recent Labs    05/30/20 0235  CHOL 226*  HDL 19*  LDLCALC 180*  TRIG 134  CHOLHDL 11.9   Thyroid Function Tests: No results for input(s): TSH, T4TOTAL, FREET4, T3FREE,  THYROIDAB in the last 72 hours. Anemia Panel: Recent Labs    05/30/20 0235 05/31/20 0354  FERRITIN 2,324* 1,243*   Sepsis Labs: Recent Labs  Lab 05/28/20 1427 05/28/20 1837  PROCALCITON 0.29  --   LATICACIDVEN 1.8 1.6    Recent Results (from the past 240 hour(s))  Blood Culture (routine x 2)     Status: None (Preliminary result)   Collection Time: 05/28/20  2:25 PM   Specimen: BLOOD LEFT HAND  Result Value Ref Range Status   Specimen Description   Final    BLOOD LEFT HAND Performed at [facility], [address] [place], [place] [address]    Special Requests   Final    BOTTLES DRAWN AEROBIC AND ANAEROBIC Blood Culture results may not be optimal due to an inadequate volume of blood received in culture bottles Performed at [facility], [address] [place], [place] [address]    Culture   Final    NO GROWTH 3 DAYS Performed at [facility], [address], [place], [place] [address]    Report Status PENDING  Incomplete  Blood Culture (routine x 2)     Status: None (Preliminary result)   Collection Time: 05/28/20  2:27 PM   Specimen: BLOOD  Result Value Ref Range Status   Specimen Description   Final    BLOOD RIGHT ANTECUBITAL Performed at [facility], [address] [place], [place] [address]    Special Requests   Final    BOTTLES DRAWN AEROBIC AND ANAEROBIC Blood Culture adequate volume Performed at [facility], [address] [place], [place] [address]    Culture   Final    NO GROWTH 3 DAYS Performed at [facility], [address], [place], [place] [address]    Report Status PENDING  Incomplete  SARS Coronavirus 2 by RT PCR (hospital order, performed in [facility] hospital lab) Nasopharyngeal Nasopharyngeal Swab     Status: Abnormal   Collection Time: 05/28/20  3:59 PM   Specimen: Nasopharyngeal Swab  Result Value Ref Range Status   SARS Coronavirus 2 POSITIVE (A)  NEGATIVE Final    Comment: RESULT CALLED  TO, READ BACK BY AND VERIFIED WITH: WEST,S. RN @1747  05/28/20 BILLINGSLEY,L (NOTE) SARS-CoV-2 target nucleic acids are DETECTED  SARS-CoV-2 RNA is generally detectable in upper respiratory specimens  during the acute phase of infection.  Positive results are indicative  of the presence of the identified virus, but do not rule out bacterial infection or co-infection with other pathogens not detected by the test.  Clinical correlation with patient history and  other diagnostic information is necessary to determine patient infection status.  The expected result is negative.  Fact Sheet for Patients:   05/30/20   Fact Sheet for Healthcare Providers:   BoilerBrush.com.cy    This test is not yet approved or cleared by the https://pope.com/ FDA and  has been authorized for detection and/or diagnosis of SARS-CoV-2 by FDA under an Emergency Use Authorization (EUA).  This EUA will remain in effect (meaning thi s test can be used) for the duration of  the COVID-19 declaration under Section 564(b)(1) of the Act, 21 U.S.C. section 360-bbb-3(b)(1), unless the authorization is terminated or revoked sooner.  Performed at [facility], [address] [place], [place]          Radiology Studies: CT ANGIO CHEST PE W OR WO CONTRAST  Addendum Date: 05/31/2020   ADDENDUM REPORT: 05/31/2020 17:48 ADDENDUM: These results were called by telephone at the time of interpretation on 05/31/2020 at 5:41pm to provider Kylen Ismael , who verbally acknowledged these results. Electronically Signed   By: 06/02/2020 M.D.   On: 05/31/2020 17:48   Result Date: 05/31/2020 CLINICAL DATA:  COVID 19 positive, 15 L O2, shortness of breath. High probability of PE. EXAM: CT ANGIOGRAPHY CHEST WITH CONTRAST TECHNIQUE: Multidetector CT imaging of the chest was performed using the standard protocol  during bolus administration of intravenous contrast. Multiplanar CT image reconstructions and MIPs were obtained to evaluate the vascular anatomy. CONTRAST:  06/02/2020 OMNIPAQUE IOHEXOL 350 MG/ML SOLN COMPARISON:  CT chest 08/28/2011 report without images. FINDINGS: Cardiovascular: Satisfactory opacification of the pulmonary arteries to the segmental level. Limited evaluation of the subsegmental level. Difficult to discern hypodensity within the right lower lobe segmental and subsegmental pulmonary arteries may represent pulmonary embolus (8:44, 7:76). The main pulmonary artery is enlarged in size measuring up 3.2 cm. Normal heart size. No pericardial effusion. Normal ventricular ratio, no definite straightening or paradoxical bowing of the interventricular septum, no retrograde reflux of intravenous contrast within the inferior vena cava and hepatic veins. Mediastinum/Nodes: No enlarged mediastinal, hilar, or axillary lymph nodes. Thyroid gland, trachea, and esophagus demonstrate no significant findings. Lungs/Pleura: Extensive diffuse peribronchovascular ground-glass and consolidative opacities. Largely spared the subpleural space. No pleural effusion. No pneumothorax. No findings to suggest pulmonary infarction. Upper Abdomen: Cholelithiasis with no gallbladder wall thickening or pericholecystic fluid. No acute abnormality. Musculoskeletal: No chest wall abnormality. No suspicious lytic or blastic osseous lesions. Multilevel degenerative change of the spine. No acute displaced fracture. Review of the MIP images confirms the above findings. IMPRESSION: 1. Possible segmental and subsegmental right lower lobe pulmonary embolus. Evaluation is markedly limited due to streak artifact and findings may just be artifactual. No right heart strain or pulmonary infarction. 2. Extensive multifocal pneumonia consistent with COVID-19 infection. Electronically Signed: By: 08/30/2011 M.D. On: 05/31/2020 17:39   VAS 06/02/2020 LOWER  EXTREMITY VENOUS (DVT)  Result Date: 05/31/2020  Lower Venous DVTStudy Indications: Ddimer elevated.  Comparison Study: no prior Performing Technologist: 06/02/2020 RVS  Examination Guidelines: A complete evaluation includes B-mode  imaging, spectral Doppler, color Doppler, and power Doppler as needed of all accessible portions of each vessel. Bilateral testing is considered an integral part of a complete examination. Limited examinations for reoccurring indications may be performed as noted. The reflux portion of the exam is performed with the patient in reverse Trendelenburg.  +---------+---------------+---------+-----------+----------+--------------+ RIGHT    CompressibilityPhasicitySpontaneityPropertiesThrombus Aging +---------+---------------+---------+-----------+----------+--------------+ CFV      Full           Yes      Yes                                 +---------+---------------+---------+-----------+----------+--------------+ SFJ      Full                                                        +---------+---------------+---------+-----------+----------+--------------+ FV Prox  Full                                                        +---------+---------------+---------+-----------+----------+--------------+ FV Mid   Full                                                        +---------+---------------+---------+-----------+----------+--------------+ FV DistalFull                                                        +---------+---------------+---------+-----------+----------+--------------+ PFV      Full                                                        +---------+---------------+---------+-----------+----------+--------------+ POP      Full           Yes      Yes                                 +---------+---------------+---------+-----------+----------+--------------+ PTV      Full                                                         +---------+---------------+---------+-----------+----------+--------------+ PERO     Full                                                        +---------+---------------+---------+-----------+----------+--------------+   +---------+---------------+---------+-----------+----------+--------------+  LEFT     CompressibilityPhasicitySpontaneityPropertiesThrombus Aging +---------+---------------+---------+-----------+----------+--------------+ CFV      Full           Yes      Yes                                 +---------+---------------+---------+-----------+----------+--------------+ SFJ      Full                                                        +---------+---------------+---------+-----------+----------+--------------+ FV Prox  Full                                                        +---------+---------------+---------+-----------+----------+--------------+ FV Mid   Full                                                        +---------+---------------+---------+-----------+----------+--------------+ FV DistalFull                                                        +---------+---------------+---------+-----------+----------+--------------+ PFV      Full                                                        +---------+---------------+---------+-----------+----------+--------------+ POP      Full           Yes      Yes                                 +---------+---------------+---------+-----------+----------+--------------+ PTV      Full                                                        +---------+---------------+---------+-----------+----------+--------------+ PERO     Full                                                        +---------+---------------+---------+-----------+----------+--------------+     Summary: BILATERAL: - No evidence of deep vein thrombosis seen in the lower extremities, bilaterally. - No  evidence of superficial venous thrombosis in the lower extremities, bilaterally. -   *See table(s) above for measurements and observations.    Preliminary  Scheduled Meds: . baricitinib  4 mg Oral Daily  . enoxaparin (LOVENOX) injection  1 mg/kg Subcutaneous Q12H  . insulin aspart  0-9 Units Subcutaneous TID WC  . Ipratropium-Albuterol  1 puff Inhalation Q6H  . lisinopril  2.5 mg Oral Daily  . methylPREDNISolone (SOLU-MEDROL) injection  60 mg Intravenous Q12H   Continuous Infusions: . remdesivir 100 mg in NS 100 mL Stopped (05/31/20 1010)     LOS: 3 days    Time spent:40 min    Keerstin Bjelland, Roselind Messier, MD [facility] Pager [phone]  If 7PM-7AM, please contact night-coverage [email or web address] Password [facility] 05/31/2020, 7:19 PM

## 2020-05-31 NOTE — Progress Notes (Signed)
Lower extremity venous has been completed.   Preliminary results in CV Proc.   Blanch Media 05/31/2020 3:43 PM

## 2020-05-31 NOTE — Progress Notes (Signed)
ANTICOAGULATION CONSULT NOTE - Initial Consult  Pharmacy Consult for Therapeutic LMWH Indication: pulmonary embolus  No Known Allergies  Patient Measurements: Height: 5\' 9"  (175.3 cm) Weight: 108.9 kg (240 lb) IBW/kg (Calculated) : 70.7  Vital Signs: Temp: 98.9 F (37.2 C) (09/17 1736) Temp Source: Oral (09/17 1736) BP: 149/110 (09/17 1736) Pulse Rate: 73 (09/17 1736)  Labs: Recent Labs    05/29/20 0500 05/29/20 0500 05/30/20 0235 05/31/20 0354  HGB 12.6*   < > 12.8* 13.5  HCT 38.2*  --  39.1 41.3  PLT 274  --  261 227  CREATININE 0.86  --  0.81 0.92   < > = values in this interval not displayed.    Estimated Creatinine Clearance: 118.1 mL/min (by C-G formula based on SCr of 0.92 mg/dL).   Medical History: History reviewed. No pertinent past medical history.  Medications: Pt not taking anticoagulants PTA  Assessment: Pt is a 31 yoM admitted with COVID-19 PNA. CTA on 9/17 reveals possible right lower lobe PE. Pharmacy consulted to dose enoxaparin.   Today, 05/31/20  CBC: WNL  D-dimer >20 --> 12.49  Previously on enoxaparin 0.5 mg/kg subQ q24h for DVT ppx  Goal of Therapy:  Monitor platelets by anticoagulation protocol: Yes   Plan:   Increase enoxaparin dose to 1 mg/kg (108 mg) subQ q12h  Monitor CBC and renal function  Follow for transition to PO anticoagulation  06/02/20, PharmD 05/31/2020,5:52 PM

## 2020-05-31 NOTE — Progress Notes (Signed)
AMION page sent to Dr. Joseph Art about patient blood pressure 144/100.

## 2020-05-31 NOTE — Plan of Care (Signed)
Progressing well with medication regimen- oxygen levels maintaining on 3 liters [place] -  Safety measures in place

## 2020-06-01 ENCOUNTER — Inpatient Hospital Stay (HOSPITAL_COMMUNITY): Payer: HRSA Program

## 2020-06-01 DIAGNOSIS — I5023 Acute on chronic systolic (congestive) heart failure: Secondary | ICD-10-CM

## 2020-06-01 LAB — CBC WITH DIFFERENTIAL/PLATELET
Abs Immature Granulocytes: 1.33 10*3/uL — ABNORMAL HIGH (ref 0.00–0.07)
Basophils Absolute: 0.1 10*3/uL (ref 0.0–0.1)
Basophils Relative: 1 %
Eosinophils Absolute: 0 10*3/uL (ref 0.0–0.5)
Eosinophils Relative: 0 %
HCT: 42.1 % (ref 39.0–52.0)
Hemoglobin: 13.6 g/dL (ref 13.0–17.0)
Immature Granulocytes: 9 %
Lymphocytes Relative: 7 %
Lymphs Abs: 1.1 10*3/uL (ref 0.7–4.0)
MCH: 28.8 pg (ref 26.0–34.0)
MCHC: 32.3 g/dL (ref 30.0–36.0)
MCV: 89.2 fL (ref 80.0–100.0)
Monocytes Absolute: 0.5 10*3/uL (ref 0.1–1.0)
Monocytes Relative: 3 %
Neutro Abs: 11.2 10*3/uL — ABNORMAL HIGH (ref 1.7–7.7)
Neutrophils Relative %: 80 %
Platelets: 201 10*3/uL (ref 150–400)
RBC: 4.72 MIL/uL (ref 4.22–5.81)
RDW: 12.4 % (ref 11.5–15.5)
WBC: 14.2 10*3/uL — ABNORMAL HIGH (ref 4.0–10.5)
nRBC: 0 % (ref 0.0–0.2)

## 2020-06-01 LAB — ECHOCARDIOGRAM COMPLETE
Area-P 1/2: 3.03 cm2
Height: 69 in
S' Lateral: 2.9 cm
Weight: 3840 oz

## 2020-06-01 LAB — COMPREHENSIVE METABOLIC PANEL
ALT: 52 U/L — ABNORMAL HIGH (ref 0–44)
AST: 22 U/L (ref 15–41)
Albumin: 3 g/dL — ABNORMAL LOW (ref 3.5–5.0)
Alkaline Phosphatase: 59 U/L (ref 38–126)
Anion gap: 11 (ref 5–15)
BUN: 26 mg/dL — ABNORMAL HIGH (ref 6–20)
CO2: 24 mmol/L (ref 22–32)
Calcium: 8.4 mg/dL — ABNORMAL LOW (ref 8.9–10.3)
Chloride: 101 mmol/L (ref 98–111)
Creatinine, Ser: 0.83 mg/dL (ref 0.61–1.24)
GFR calc Af Amer: >60 mL/min (ref >60)
GFR calc non Af Amer: >60 mL/min (ref >60)
Glucose, Bld: 245 mg/dL — ABNORMAL HIGH (ref 70–99)
Potassium: 5.3 mmol/L — ABNORMAL HIGH (ref 3.5–5.1)
Sodium: 136 mmol/L (ref 135–145)
Total Bilirubin: 0.8 mg/dL (ref 0.3–1.2)
Total Protein: 6.6 g/dL (ref 6.5–8.1)

## 2020-06-01 LAB — LACTATE DEHYDROGENASE: LDH: 294 U/L — ABNORMAL HIGH (ref 98–192)

## 2020-06-01 LAB — GLUCOSE, CAPILLARY
Glucose-Capillary: 226 mg/dL — ABNORMAL HIGH (ref 70–99)
Glucose-Capillary: 321 mg/dL — ABNORMAL HIGH (ref 70–99)
Glucose-Capillary: 224 mg/dL — ABNORMAL HIGH (ref 70–99)
Glucose-Capillary: 251 mg/dL — ABNORMAL HIGH (ref 70–99)

## 2020-06-01 LAB — D-DIMER, QUANTITATIVE: D-Dimer, Quant: 7.39 ug/mL-FEU — ABNORMAL HIGH (ref 0.00–0.50)

## 2020-06-01 LAB — MAGNESIUM: Magnesium: 2.3 mg/dL (ref 1.7–2.4)

## 2020-06-01 LAB — PHOSPHORUS: Phosphorus: 4.8 mg/dL — ABNORMAL HIGH (ref 2.5–4.6)

## 2020-06-01 LAB — C-REACTIVE PROTEIN: CRP: 1.7 mg/dL — ABNORMAL HIGH (ref <1.0)

## 2020-06-01 LAB — FERRITIN: Ferritin: 955 ng/mL — ABNORMAL HIGH (ref 24–336)

## 2020-06-01 MED ORDER — SODIUM ZIRCONIUM CYCLOSILICATE 10 G PO PACK
10.0000 g | PACK | Freq: Once | ORAL | Status: AC
  Administered 2020-06-01: 10 g via ORAL
  Filled 2020-06-01: qty 1

## 2020-06-01 MED ORDER — FUROSEMIDE 10 MG/ML IJ SOLN
60.0000 mg | Freq: Once | INTRAMUSCULAR | Status: AC
  Administered 2020-06-01: 60 mg via INTRAVENOUS
  Filled 2020-06-01: qty 6

## 2020-06-01 NOTE — Progress Notes (Signed)
PROGRESS NOTE    Littleton Haub  VXY:[id number] DOB: 1971-08-09 DOA: 05/28/2020 PCP: [facility]     Brief Narrative:  Sean Reynolds is a 49 y.o. WM PMHx no significant medical history   who presents with concerns of increasing shortness of breath.  About 10 days ago he was at a pool party and the following day felt like his face was burning and had increasing fatigue and lethargy.  Had chills but no objective fever.  Had frequent productive cough associated with nausea and chest discomfort. Has loss of taste and smell. Denies any vomiting or diarrhea.  He then presented to urgent care about 4 days ago and was found to be Covid positive.  Also later realized that his sister and brother-in-law who were at the pool party also were positive for Covid.  Today he began to feel more shortness of breath and felt like he almost passed out on his way into the ER.  Patient denies any tobacco or illicit drug use.  Has occasional alcohol use.  ED Course: He was febrile up to 100.6, tachycardic and tachypneic and was initially found to be at 50% oxygen saturation on room air.  Initially placed on nonrebreather and was able to titrate back down to 6 L. CBC unremarkable.  Lactic acid of 1.8.  CMP with mild elevated AST and ALT.  Procalcitonin ambiguous at 0.29.  CRP of 20.7.   Subjective: 9/18 afebrile overnight positive S OB, negative CP, negative abdominal pain, negative nausea, negative vomiting.   Assessment & Plan: Covid vaccination;   Active Problems:   Pneumonia due to COVID-19 virus   Acute on chronic respiratory failure with hypoxia (HCC)   Hyperglycemia   Transaminitis   SIRS (systemic inflammatory response syndrome) (HCC)   Pulmonary embolism (HCC)  SIRS -On admission patient required higher for SIRS HR> 90, RR> 20  Acute respiratory failure with hypoxia/Covid pneumonia COVID-19 Labs  Recent Labs    05/30/20 0235 05/31/20 0354 06/01/20 0422  DDIMER >20.00*  12.49* 7.39*  FERRITIN 2,324* 1,243* 955*  LDH 396* 336* 294*  CRP 8.1* 2.4* 1.7*    Lab Results  Component Value Date   SARSCOV2NAA POSITIVE (A) 05/28/2020  -Baricitinib per pharmacy protocol x14 days -Solu-Medrol 60 mg BID x10 days -Remdesivir per pharmacy protocol x5 days -Respimat QID -Incentive spirometry -Flutter valve (on back order) -Titrate O2 to maintain SPO2> 88%   Prediabetes -9/14 hemoglobin A1c= 6.4 this meets criteria for prediabetes. -Sensitive SSI  HLD -9/16 LDL= 180 -We will hold statin during Covid treatment but would start Lipitor upon discharge.  Essential HTN -9/17 increase lisinopril 5 mg daily -9/17 hydralazine IV prn -9/18 BP better controlled today  Mild transaminitis AST of 46 and ALT of 52 Secondary to Covid infection.  Continue to monitor daily.  Acute PE --Increaseing O2 demand PE/DVT? --CTA PE protocol pending --Lower extremity Doppler; negative DVT see results below  -9/17 echocardiogram; negative evidence of right heart strain see results below   Hyperkalemia -Lasix IV 60 mg -Lokelma 10 g x 1    DVT prophylaxis: Lovenox Code Status: Full  Family Communication:  Status is: Inpatient    Dispo: The patient is from: Home              Anticipated d/c is to: Home              Anticipated d/c date is: 9/20              Patient  currently unstable      Consultants:    Procedures/Significant Events:  9/17 CTA chest PE protocol;Possible segmental and subsegmental right lower lobe pulmonary embolus. Evaluation is markedly limited due to streak artifact and findings may just be artifactual. No right heart strain or pulmonary infarction. 2. Extensive multifocal pneumonia consistent with COVID-19 Infection. 9/18 bilateral lower extremity Doppler; negative DVT bilateral 9/18 echocardiogram;Left Ventricle: LVEF = 60 to 65%. T   I have personally reviewed and interpreted all radiology studies and my findings are as  above.  VENTILATOR SETTINGS: Nasal cannula 9/18 Flow; 13 L/min SPO2 90%   Cultures   Antimicrobials: Anti-infectives (From admission, onward)   Start     Ordered Stop   05/29/20 1000  remdesivir 100 mg in sodium chloride 0.9 % 100 mL IVPB       "Followed by" Linked Group Details   05/28/20 1708 06/02/20 0959   05/28/20 1800  remdesivir 200 mg in sodium chloride 0.9% 250 mL IVPB       "Followed by" Linked Group Details   05/28/20 1708 05/28/20 2055       Devices    LINES / TUBES:      Continuous Infusions: . remdesivir 100 mg in NS 100 mL 100 mg (06/01/20 0955)     Objective: Vitals:   05/31/20 1858 05/31/20 1951 06/01/20 0425 06/01/20 0911  BP: (!) 144/100 (!) 146/97 (!) 131/91   Pulse: 76 74 67   Resp:  20 20   Temp:  97.9 F (36.6 C) 98.1 F (36.7 C)   TempSrc:  Oral    SpO2:  97% 93% 90%  Weight:      Height:        Intake/Output Summary (Last 24 hours) at 06/01/2020 1007 Last data filed at 05/31/2020 1340 Gross per 24 hour  Intake 826 ml  Output --  Net 826 ml   Filed Weights   05/28/20 2100  Weight: 108.9 kg   Physical Exam:  General: A/O x4, positive acute respiratory distress Eyes: negative scleral hemorrhage, negative anisocoria, negative icterus ENT: Negative Runny nose, negative gingival bleeding, Neck:  Negative scars, masses, torticollis, lymphadenopathy, JVD Lungs: tachypneic, decreased/absent breath sounds right lung field, positive expiratory wheeze left lung field  Cardiovascular: Regular rate and rhythm without murmur gallop or rub normal S1 and S2 Abdomen: negative abdominal pain, nondistended, positive soft, bowel sounds, no rebound, no ascites, no appreciable mass Extremities: No significant cyanosis, clubbing, or edema bilateral lower extremities Skin: Negative rashes, lesions, ulcers Psychiatric:  Negative depression, negative anxiety, negative fatigue, negative mania  Central nervous system:  Cranial nerves II through XII  intact, tongue/uvula midline, all extremities muscle strength 5/5, sensation intact throughout, negative dysarthria, negative expressive aphasia, negative receptive aphasia.  .     Data Reviewed: Care during the described time interval was provided by me .  I have reviewed this patient's available data, including medical history, events of note, physical examination, and all test results as part of my evaluation.  CBC: Recent Labs  Lab 05/28/20 1427 05/29/20 0500 05/30/20 0235 05/31/20 0354 06/01/20 0422  WBC 9.2 7.1 10.7* 14.5* 14.2*  NEUTROABS 6.7 5.4 9.3* 11.3* 11.2*  HGB 13.9 12.6* 12.8* 13.5 13.6  HCT 41.5 38.2* 39.1 41.3 42.1  MCV 87.7 89.5 89.5 90.2 89.2  PLT 268 274 261 227 201   Basic Metabolic Panel: Recent Labs  Lab 05/28/20 1427 05/29/20 0500 05/30/20 0235 05/31/20 0354 06/01/20 0422  NA 139 139 137 138 136  K  3.5 4.2 4.3 5.0 5.3*  CL 99 102 100 101 101  CO2 GLUCOSE 172* 187* 253* 266* 245*  BUN 23* 23* 26* 28* 26*  CREATININE 0.73 0.86 0.81 0.92 0.83  CALCIUM 8.4* 8.3* 8.5* 8.5* 8.4*  MG  --   --  2.5* 2.2 2.3  PHOS  --   --  3.4 4.7* 4.8*   GFR: Estimated Creatinine Clearance: 131 mL/min (by C-G formula based on SCr of 0.83 mg/dL). Liver Function Tests: Recent Labs  Lab 05/28/20 1427 05/29/20 0500 05/30/20 0235 05/31/20 0354 06/01/20 0422  AST 46* 41 34 35 22  ALT 52* 52* 58* 63* 52*  ALKPHOS 82 73 75 66 59  BILITOT 0.7 0.8 0.7 0.7 0.8  PROT 7.7 7.2 7.3 7.1 6.6  ALBUMIN 3.0* 2.8* 2.9* 2.9* 3.0*   No results for input(s): LIPASE, AMYLASE in the last 168 hours. No results for input(s): AMMONIA in the last 168 hours. Coagulation Profile: No results for input(s): INR, PROTIME in the last 168 hours. Cardiac Enzymes: No results for input(s): CKTOTAL, CKMB, CKMBINDEX, TROPONINI in the last 168 hours. BNP (last 3 results) No results for input(s): PROBNP in the last 8760 hours. HbA1C: No results for input(s): HGBA1C in the last  72 hours. CBG: Recent Labs  Lab 05/31/20 0749 05/31/20 1131 05/31/20 1602 05/31/20 2044 06/01/20 0746  GLUCAP 240* 358* 281* 236* 226*   Lipid Profile: Recent Labs    05/30/20 0235  CHOL 226*  HDL 19*  LDLCALC 180*  TRIG 134  CHOLHDL 11.9   Thyroid Function Tests: No results for input(s): TSH, T4TOTAL, FREET4, T3FREE, THYROIDAB in the last 72 hours. Anemia Panel: Recent Labs    05/31/20 0354 06/01/20 0422  FERRITIN 1,243* 955*   Sepsis Labs: Recent Labs  Lab 05/28/20 1427 05/28/20 1837  PROCALCITON 0.29  --   LATICACIDVEN 1.8 1.6    Recent Results (from the past 240 hour(s))  Blood Culture (routine x 2)     Status: None (Preliminary result)   Collection Time: 05/28/20  2:25 PM   Specimen: BLOOD LEFT HAND  Result Value Ref Range Status   Specimen Description   Final    BLOOD LEFT HAND Performed at [facility], [address] [place], [place] [address]    Special Requests   Final    BOTTLES DRAWN AEROBIC AND ANAEROBIC Blood Culture results may not be optimal due to an inadequate volume of blood received in culture bottles Performed at [facility], [address] [place], [place] [address]    Culture   Final    NO GROWTH 3 DAYS Performed at [facility], [address], [place], [place] [address]    Report Status PENDING  Incomplete  Blood Culture (routine x 2)     Status: None (Preliminary result)   Collection Time: 05/28/20  2:27 PM   Specimen: BLOOD  Result Value Ref Range Status   Specimen Description   Final    BLOOD RIGHT ANTECUBITAL Performed at [facility], [address], [place], [place] [address]    Special Requests   Final    BOTTLES DRAWN AEROBIC AND ANAEROBIC Blood Culture adequate volume Performed at [facility], [address], [place], [place] [address]    Culture   Final    NO GROWTH 3 DAYS Performed at [facility], [address]  [place], [place] [address]  Report Status PENDING  Incomplete  SARS Coronavirus 2 by RT PCR (hospital order, performed in [facility] hospital lab) Nasopharyngeal Nasopharyngeal Swab     Status: Abnormal   Collection Time: 05/28/20  3:59 PM   Specimen: Nasopharyngeal Swab  Result Value Ref Range Status   SARS Coronavirus 2 POSITIVE (A) NEGATIVE Final    Comment: RESULT CALLED TO, READ BACK BY AND VERIFIED WITH: WEST,S. RN @1747  05/28/20 BILLINGSLEY,L (NOTE) SARS-CoV-2 target nucleic acids are DETECTED  SARS-CoV-2 RNA is generally detectable in upper respiratory specimens  during the acute phase of infection.  Positive results are indicative  of the presence of the identified virus, but do not rule out bacterial infection or co-infection with other pathogens not detected by the test.  Clinical correlation with patient history and  other diagnostic information is necessary to determine patient infection status.  The expected result is negative.  Fact Sheet for Patients:   05/30/20   Fact Sheet for Healthcare Providers:   BoilerBrush.com.cy    This test is not yet approved or cleared by the https://pope.com/ FDA and  has been authorized for detection and/or diagnosis of SARS-CoV-2 by FDA under an Emergency Use Authorization (EUA).  This EUA will remain in effect (meaning thi s test can be used) for the duration of  the COVID-19 declaration under Section 564(b)(1) of the Act, 21 U.S.C. section 360-bbb-3(b)(1), unless the authorization is terminated or revoked sooner.  Performed at [facility], [address], [place], [place]          Radiology Studies: CT ANGIO CHEST PE W OR WO CONTRAST  Addendum Date: 05/31/2020   ADDENDUM REPORT: 05/31/2020 17:48 ADDENDUM: These results were called by telephone at the time of interpretation on 05/31/2020 at 5:41pm to provider Rickayla Wieland , who verbally  acknowledged these results. Electronically Signed   By: 06/02/2020 M.D.   On: 05/31/2020 17:48   Result Date: 05/31/2020 CLINICAL DATA:  COVID 19 positive, 15 L O2, shortness of breath. High probability of PE. EXAM: CT ANGIOGRAPHY CHEST WITH CONTRAST TECHNIQUE: Multidetector CT imaging of the chest was performed using the standard protocol during bolus administration of intravenous contrast. Multiplanar CT image reconstructions and MIPs were obtained to evaluate the vascular anatomy. CONTRAST:  06/02/2020 OMNIPAQUE IOHEXOL 350 MG/ML SOLN COMPARISON:  CT chest 08/28/2011 report without images. FINDINGS: Cardiovascular: Satisfactory opacification of the pulmonary arteries to the segmental level. Limited evaluation of the subsegmental level. Difficult to discern hypodensity within the right lower lobe segmental and subsegmental pulmonary arteries may represent pulmonary embolus (8:44, 7:76). The main pulmonary artery is enlarged in size measuring up 3.2 cm. Normal heart size. No pericardial effusion. Normal ventricular ratio, no definite straightening or paradoxical bowing of the interventricular septum, no retrograde reflux of intravenous contrast within the inferior vena cava and hepatic veins. Mediastinum/Nodes: No enlarged mediastinal, hilar, or axillary lymph nodes. Thyroid gland, trachea, and esophagus demonstrate no significant findings. Lungs/Pleura: Extensive diffuse peribronchovascular ground-glass and consolidative opacities. Largely spared the subpleural space. No pleural effusion. No pneumothorax. No findings to suggest pulmonary infarction. Upper Abdomen: Cholelithiasis with no gallbladder wall thickening or pericholecystic fluid. No acute abnormality. Musculoskeletal: No chest wall abnormality. No suspicious lytic or blastic osseous lesions. Multilevel degenerative change of the spine. No acute displaced fracture. Review of the MIP images confirms the above findings. IMPRESSION: 1. Possible segmental  and subsegmental right lower lobe pulmonary embolus. Evaluation is markedly limited due to streak artifact and findings may just be  artifactual. No right heart strain or pulmonary infarction. 2. Extensive multifocal pneumonia consistent with COVID-19 infection. Electronically Signed: By: Tish FredericksonMorgane  Naveau M.D. On: 05/31/2020 17:39   VAS US LOWER EXTREMITY VENOUS (DVT)  Result Date: 05/31/2020  Lower Venous DVTStudy Indications: Ddimer elevated.  Comparison Study: no prior Performing Technologist: Blanch MediaMegan Riddle RVS  Examination Guidelines: A complete evaluation includes B-mode imaging, spectral Doppler, color Doppler, and power Doppler as needed of all accessible portions of each vessel. Bilateral testing is considered an integral part of a complete examination. Limited examinations for reoccurring indications may be performed as noted. The reflux portion of the exam is performed with the patient in reverse Trendelenburg.  +---------+---------------+---------+-----------+----------+--------------+ RIGHT    CompressibilityPhasicitySpontaneityPropertiesThrombus Aging +---------+---------------+---------+-----------+----------+--------------+ CFV      Full           Yes      Yes                                 +---------+---------------+---------+-----------+----------+--------------+ SFJ      Full                                                        +---------+---------------+---------+-----------+----------+--------------+ FV Prox  Full                                                        +---------+---------------+---------+-----------+----------+--------------+ FV Mid   Full                                                        +---------+---------------+---------+-----------+----------+--------------+ FV DistalFull                                                        +---------+---------------+---------+-----------+----------+--------------+ PFV      Full                                                         +---------+---------------+---------+-----------+----------+--------------+ POP      Full           Yes      Yes                                 +---------+---------------+---------+-----------+----------+--------------+ PTV      Full                                                        +---------+---------------+---------+-----------+----------+--------------+  PERO     Full                                                        +---------+---------------+---------+-----------+----------+--------------+   +---------+---------------+---------+-----------+----------+--------------+ LEFT     CompressibilityPhasicitySpontaneityPropertiesThrombus Aging +---------+---------------+---------+-----------+----------+--------------+ CFV      Full           Yes      Yes                                 +---------+---------------+---------+-----------+----------+--------------+ SFJ      Full                                                        +---------+---------------+---------+-----------+----------+--------------+ FV Prox  Full                                                        +---------+---------------+---------+-----------+----------+--------------+ FV Mid   Full                                                        +---------+---------------+---------+-----------+----------+--------------+ FV DistalFull                                                        +---------+---------------+---------+-----------+----------+--------------+ PFV      Full                                                        +---------+---------------+---------+-----------+----------+--------------+ POP      Full           Yes      Yes                                 +---------+---------------+---------+-----------+----------+--------------+ PTV      Full                                                         +---------+---------------+---------+-----------+----------+--------------+ PERO     Full                                                        +---------+---------------+---------+-----------+----------+--------------+  Summary: BILATERAL: - No evidence of deep vein thrombosis seen in the lower extremities, bilaterally. - No evidence of superficial venous thrombosis in the lower extremities, bilaterally. -   *See table(s) above for measurements and observations.    Preliminary         Scheduled Meds: . baricitinib  4 mg Oral Daily  . enoxaparin (LOVENOX) injection  110 mg Subcutaneous Q12H  . insulin aspart  0-9 Units Subcutaneous TID WC  . Ipratropium-Albuterol  1 puff Inhalation Q6H  . lisinopril  2.5 mg Oral Daily  . methylPREDNISolone (SOLU-MEDROL) injection  60 mg Intravenous Q12H   Continuous Infusions: . remdesivir 100 mg in NS 100 mL 100 mg (06/01/20 0955)     LOS: 4 days    Time spent:40 min    Marcella Dunnaway, Roselind Messier, MD [facility] Pager [phone]  If 7PM-7AM, please contact night-coverage [email or web address] Password [facility] 06/01/2020, 10:07 AM

## 2020-06-01 NOTE — Progress Notes (Signed)
  Echocardiogram 2D Echocardiogram has been performed.  Sean Reynolds [name] 06/01/2020, 10:36 AM

## 2020-06-02 LAB — PHOSPHORUS: Phosphorus: 5.1 mg/dL — ABNORMAL HIGH (ref 2.5–4.6)

## 2020-06-02 LAB — CULTURE, BLOOD (ROUTINE X 2)
Culture: NO GROWTH
Culture: NO GROWTH
Special Requests: ADEQUATE

## 2020-06-02 LAB — GLUCOSE, CAPILLARY
Glucose-Capillary: 307 mg/dL — ABNORMAL HIGH (ref 70–99)
Glucose-Capillary: 288 mg/dL — ABNORMAL HIGH (ref 70–99)
Glucose-Capillary: 258 mg/dL — ABNORMAL HIGH (ref 70–99)
Glucose-Capillary: 275 mg/dL — ABNORMAL HIGH (ref 70–99)

## 2020-06-02 LAB — COMPREHENSIVE METABOLIC PANEL
ALT: 41 U/L (ref 0–44)
AST: 12 U/L — ABNORMAL LOW (ref 15–41)
Albumin: 3.2 g/dL — ABNORMAL LOW (ref 3.5–5.0)
Alkaline Phosphatase: 60 U/L (ref 38–126)
Anion gap: 10 (ref 5–15)
BUN: 27 mg/dL — ABNORMAL HIGH (ref 6–20)
CO2: 29 mmol/L (ref 22–32)
Calcium: 8.7 mg/dL — ABNORMAL LOW (ref 8.9–10.3)
Chloride: 96 mmol/L — ABNORMAL LOW (ref 98–111)
Creatinine, Ser: 0.78 mg/dL (ref 0.61–1.24)
GFR calc Af Amer: >60 mL/min (ref >60)
GFR calc non Af Amer: >60 mL/min (ref >60)
Glucose, Bld: 251 mg/dL — ABNORMAL HIGH (ref 70–99)
Potassium: 4.8 mmol/L (ref 3.5–5.1)
Sodium: 135 mmol/L (ref 135–145)
Total Bilirubin: 0.9 mg/dL (ref 0.3–1.2)
Total Protein: 6.9 g/dL (ref 6.5–8.1)

## 2020-06-02 LAB — CBC WITH DIFFERENTIAL/PLATELET
Abs Immature Granulocytes: 1.13 10*3/uL — ABNORMAL HIGH (ref 0.00–0.07)
Basophils Absolute: 0.1 10*3/uL (ref 0.0–0.1)
Basophils Relative: 1 %
Eosinophils Absolute: 0 10*3/uL (ref 0.0–0.5)
Eosinophils Relative: 0 %
HCT: 43.2 % (ref 39.0–52.0)
Hemoglobin: 14.2 g/dL (ref 13.0–17.0)
Immature Granulocytes: 8 %
Lymphocytes Relative: 6 %
Lymphs Abs: 0.9 10*3/uL (ref 0.7–4.0)
MCH: 29 pg (ref 26.0–34.0)
MCHC: 32.9 g/dL (ref 30.0–36.0)
MCV: 88.3 fL (ref 80.0–100.0)
Monocytes Absolute: 0.5 10*3/uL (ref 0.1–1.0)
Monocytes Relative: 4 %
Neutro Abs: 11.4 10*3/uL — ABNORMAL HIGH (ref 1.7–7.7)
Neutrophils Relative %: 81 %
Platelets: 253 10*3/uL (ref 150–400)
RBC: 4.89 MIL/uL (ref 4.22–5.81)
RDW: 12.6 % (ref 11.5–15.5)
WBC: 14 10*3/uL — ABNORMAL HIGH (ref 4.0–10.5)
nRBC: 0 % (ref 0.0–0.2)

## 2020-06-02 LAB — FERRITIN: Ferritin: 982 ng/mL — ABNORMAL HIGH (ref 24–336)

## 2020-06-02 LAB — C-REACTIVE PROTEIN: CRP: 1.5 mg/dL — ABNORMAL HIGH (ref <1.0)

## 2020-06-02 LAB — D-DIMER, QUANTITATIVE: D-Dimer, Quant: 3.17 ug/mL-FEU — ABNORMAL HIGH (ref 0.00–0.50)

## 2020-06-02 LAB — LACTATE DEHYDROGENASE: LDH: 206 U/L — ABNORMAL HIGH (ref 98–192)

## 2020-06-02 LAB — MAGNESIUM: Magnesium: 2.5 mg/dL — ABNORMAL HIGH (ref 1.7–2.4)

## 2020-06-02 MED ORDER — SODIUM ZIRCONIUM CYCLOSILICATE 10 G PO PACK
10.0000 g | PACK | Freq: Once | ORAL | Status: AC
  Administered 2020-06-02: 10 g via ORAL
  Filled 2020-06-02 (×2): qty 1

## 2020-06-02 MED ORDER — INSULIN DETEMIR 100 UNIT/ML ~~LOC~~ SOLN
10.0000 [IU] | Freq: Two times a day (BID) | SUBCUTANEOUS | Status: DC
  Administered 2020-06-02 – 2020-06-05 (×6): 10 [IU] via SUBCUTANEOUS
  Filled 2020-06-02 (×7): qty 0.1

## 2020-06-02 NOTE — Progress Notes (Signed)
PROGRESS NOTE    Govanni Reynolds  ZOX:[id number] DOB: [birth year] DOA: 05/28/2020 PCP: [facility]     Brief Narrative:  Sean Reynolds is a 49 y.o. WM PMHx no significant medical history   who presents with concerns of increasing shortness of breath.  About 10 days ago he was at a pool party and the following day felt like his face was burning and had increasing fatigue and lethargy.  Had chills but no objective fever.  Had frequent productive cough associated with nausea and chest discomfort. Has loss of taste and smell. Denies any vomiting or diarrhea.  He then presented to urgent care about 4 days ago and was found to be Covid positive.  Also later realized that his sister and brother-in-law who were at the pool party also were positive for Covid.  Today he began to feel more shortness of breath and felt like he almost passed out on his way into the ER.  Patient denies any tobacco or illicit drug use.  Has occasional alcohol use.  ED Course: He was febrile up to 100.6, tachycardic and tachypneic and was initially found to be at 50% oxygen saturation on room air.  Initially placed on nonrebreather and was able to titrate back down to 6 L. CBC unremarkable.  Lactic acid of 1.8.  CMP with mild elevated AST and ALT.  Procalcitonin ambiguous at 0.29.  CRP of 20.7.   Subjective: 9/19 Afebrile overnight A/O x4, positive S OB but improving, negative CP.    Assessment & Plan: Covid vaccination;   Active Problems:   Pneumonia due to COVID-19 virus   Acute on chronic respiratory failure with hypoxia (HCC)   Hyperglycemia   Transaminitis   SIRS (systemic inflammatory response syndrome) (HCC)   Pulmonary embolism (HCC)  SIRS -On admission patient required higher for SIRS HR> 90, RR> 20  Acute respiratory failure with hypoxia/Covid pneumonia COVID-19 Labs  Recent Labs    05/31/20 0354 06/01/20 0422 06/02/20 0530  DDIMER 12.49* 7.39* 3.17*  FERRITIN 1,243* 955*  982*  LDH 336* 294* 206*  CRP 2.4* 1.7* 1.5*    Lab Results  Component Value Date   SARSCOV2NAA POSITIVE (A) 05/28/2020  -Baricitinib per pharmacy protocol x14 days -Solu-Medrol 60 mg BID x10 days -Remdesivir per pharmacy protocol x5 days -Respimat QID -Incentive spirometry -Flutter valve (on back order) -Titrate O2 to maintain SPO2> 88%   Prediabetes -9/14 hemoglobin A1c= 6.4 this meets criteria for prediabetes. -9/19 Levemir 10 units BID -Sensitive SSI  HLD -9/16 LDL= 180 -We will hold statin during Covid treatment but would start Lipitor upon discharge.  Essential HTN -9/17 increase lisinopril 5 mg daily -9/17 hydralazine IV prn -9/18 BP better controlled today  Mild transaminitis AST of 46 and ALT of 52 Secondary to Covid infection.  Continue to monitor daily.  Acute PE --Increaseing O2 demand PE/DVT? --CTA PE protocol pending --Lower extremity Doppler; negative DVT see results below  -9/17 echocardiogram; negative evidence of right heart strain see results below   Hyperkalemia -9/19 Lokelma 10 g x 1   DVT prophylaxis: Lovenox Code Status: Full  Family Communication:  Status is: Inpatient    Dispo: The patient is from: Home              Anticipated d/c is to: Home              Anticipated d/c date is: 9/20              Patient currently  unstable      Consultants:    Procedures/Significant Events:  9/17 CTA chest PE protocol;Possible segmental and subsegmental right lower lobe pulmonary embolus. Evaluation is markedly limited due to streak artifact and findings may just be artifactual. No right heart strain or pulmonary infarction. 2. Extensive multifocal pneumonia consistent with COVID-19 Infection. 9/18 bilateral lower extremity Doppler; negative DVT bilateral 9/18 echocardiogram;Left Ventricle: LVEF = 60 to 65%.    I have personally reviewed and interpreted all radiology studies and my findings are as above.  VENTILATOR  SETTINGS: Nasal cannula 9/19 Flow; 10 L/min SPO2 98%   Cultures   Antimicrobials: Anti-infectives (From admission, onward)   Start     Ordered Stop   05/29/20 1000  remdesivir 100 mg in sodium chloride 0.9 % 100 mL IVPB       "Followed by" Linked Group Details   05/28/20 1708 06/02/20 0959   05/28/20 1800  remdesivir 200 mg in sodium chloride 0.9% 250 mL IVPB       "Followed by" Linked Group Details   05/28/20 1708 05/28/20 2055       Devices    LINES / TUBES:      Continuous Infusions:    Objective: Vitals:   06/01/20 1321 06/01/20 1933 06/02/20 0444 06/02/20 1321  BP: 128/87 136/88 110/68 122/81  Pulse: 76 79 (!) 58 85  Resp: (!) 22 20 (!) 22 18  Temp: 98.4 F (36.9 C) 98.5 F (36.9 C) 97.7 F (36.5 C) 98.4 F (36.9 C)  TempSrc: Oral   Oral  SpO2: 97% 99% 100% 98%  Weight:      Height:        Intake/Output Summary (Last 24 hours) at 06/02/2020 1345 Last data filed at 06/02/2020 1247 Gross per 24 hour  Intake 960 ml  Output --  Net 960 ml   Filed Weights   05/28/20 2100  Weight: 108.9 kg   Physical Exam:  General: A/O x4, positive acute respiratory distress Eyes: negative scleral hemorrhage, negative anisocoria, negative icterus ENT: Negative Runny nose, negative gingival bleeding, Neck:  Negative scars, masses, torticollis, lymphadenopathy, JVD Lungs: decreased breath sounds bilaterally without wheezes or crackles Cardiovascular: Regular rate and rhythm without murmur gallop or rub normal S1 and S2 Abdomen: negative abdominal pain, nondistended, positive soft, bowel sounds, no rebound, no ascites, no appreciable mass Extremities: No significant cyanosis, clubbing, or edema bilateral lower extremities Skin: Negative rashes, lesions, ulcers Psychiatric:  Negative depression, negative anxiety, negative fatigue, negative mania  Central nervous system:  Cranial nerves II through XII intact, tongue/uvula midline, all extremities muscle strength 5/5,  sensation intact throughout, negative dysarthria, negative expressive aphasia, negative receptive aphasia. .     Data Reviewed: Care during the described time interval was provided by me .  I have reviewed this patient's available data, including medical history, events of note, physical examination, and all test results as part of my evaluation.  CBC: Recent Labs  Lab 05/29/20 0500 05/30/20 0235 05/31/20 0354 06/01/20 0422 06/02/20 0530  WBC 7.1 10.7* 14.5* 14.2* 14.0*  NEUTROABS 5.4 9.3* 11.3* 11.2* 11.4*  HGB 12.6* 12.8* 13.5 13.6 14.2  HCT 38.2* 39.1 41.3 42.1 43.2  MCV 89.5 89.5 90.2 89.2 88.3  PLT 274 261 227 201 253   Basic Metabolic Panel: Recent Labs  Lab 05/29/20 0500 05/30/20 0235 05/31/20 0354 06/01/20 0422 06/02/20 0530  NA 139 137 138 136 135  K 4.2 4.3 5.0 5.3* 4.8  CL 102 100 101 101 96*  CO2 26  25 28 24 29   GLUCOSE 187* 253* 266* 245* 251*  BUN 23* 26* 28* 26* 27*  CREATININE 0.86 0.81 0.92 0.83 0.78  CALCIUM 8.3* 8.5* 8.5* 8.4* 8.7*  MG  --  2.5* 2.2 2.3 2.5*  PHOS  --  3.4 4.7* 4.8* 5.1*   GFR: Estimated Creatinine Clearance: 135.9 mL/min (by C-G formula based on SCr of 0.78 mg/dL). Liver Function Tests: Recent Labs  Lab 05/29/20 0500 05/30/20 0235 05/31/20 0354 06/01/20 0422 06/02/20 0530  AST 41 34 35 22 12*  ALT 52* 58* 63* 52* 41  ALKPHOS 73 75 66 59 60  BILITOT 0.8 0.7 0.7 0.8 0.9  PROT 7.2 7.3 7.1 6.6 6.9  ALBUMIN 2.8* 2.9* 2.9* 3.0* 3.2*   No results for input(s): LIPASE, AMYLASE in the last 168 hours. No results for input(s): AMMONIA in the last 168 hours. Coagulation Profile: No results for input(s): INR, PROTIME in the last 168 hours. Cardiac Enzymes: No results for input(s): CKTOTAL, CKMB, CKMBINDEX, TROPONINI in the last 168 hours. BNP (last 3 results) No results for input(s): PROBNP in the last 8760 hours. HbA1C: No results for input(s): HGBA1C in the last 72 hours. CBG: Recent Labs  Lab 06/01/20 1125  06/01/20 1623 06/01/20 2029 06/02/20 0740 06/02/20 1144  GLUCAP 321* 224* 251* 307* 288*   Lipid Profile: No results for input(s): CHOL, HDL, LDLCALC, TRIG, CHOLHDL, LDLDIRECT in the last 72 hours. Thyroid Function Tests: No results for input(s): TSH, T4TOTAL, FREET4, T3FREE, THYROIDAB in the last 72 hours. Anemia Panel: Recent Labs    06/01/20 0422 06/02/20 0530  FERRITIN 955* 982*   Sepsis Labs: Recent Labs  Lab 05/28/20 1427 05/28/20 1837  PROCALCITON 0.29  --   LATICACIDVEN 1.8 1.6    Recent Results (from the past 240 hour(s))  Blood Culture (routine x 2)     Status: None (Preliminary result)   Collection Time: 05/28/20  2:25 PM   Specimen: BLOOD LEFT HAND  Result Value Ref Range Status   Specimen Description   Final    BLOOD LEFT HAND Performed at [facility], [address] [place], [place]    Special Requests   Final    BOTTLES DRAWN AEROBIC AND ANAEROBIC Blood Culture results may not be optimal due to an inadequate volume of blood received in culture bottles Performed at [facility], [address], [place], [place]    Culture   Final    NO GROWTH 4 DAYS Performed at [facility], [address], [place], [place]    Report Status PENDING  Incomplete  Blood Culture (routine x 2)     Status: None (Preliminary result)   Collection Time: 05/28/20  2:27 PM   Specimen: BLOOD  Result Value Ref Range Status   Specimen Description   Final    BLOOD RIGHT ANTECUBITAL Performed at [facility], [address], [place], [place]    Special Requests   Final    BOTTLES DRAWN AEROBIC AND ANAEROBIC Blood Culture adequate volume Performed at [facility], [address], [place], [place]    Culture   Final    NO GROWTH 4 DAYS Performed at [facility], [address], [place], [place]    Report Status PENDING   Incomplete  SARS Coronavirus 2 by RT PCR (hospital order, performed in [facility] hospital lab) Nasopharyngeal Nasopharyngeal Swab     Status:  Abnormal   Collection Time: 05/28/20  3:59 PM   Specimen: Nasopharyngeal Swab  Result Value Ref Range Status   SARS Coronavirus 2 POSITIVE (A) NEGATIVE Final    Comment: RESULT CALLED TO, READ BACK BY AND VERIFIED WITH: WEST,S. RN @1747  05/28/20 BILLINGSLEY,L (NOTE) SARS-CoV-2 target nucleic acids are DETECTED  SARS-CoV-2 RNA is generally detectable in upper respiratory specimens  during the acute phase of infection.  Positive results are indicative  of the presence of the identified virus, but do not rule out bacterial infection or co-infection with other pathogens not detected by the test.  Clinical correlation with patient history and  other diagnostic information is necessary to determine patient infection status.  The expected result is negative.  Fact Sheet for Patients:   05/30/20   Fact Sheet for Healthcare Providers:   BoilerBrush.com.cy    This test is not yet approved or cleared by the https://pope.com/ FDA and  has been authorized for detection and/or diagnosis of SARS-CoV-2 by FDA under an Emergency Use Authorization (EUA).  This EUA will remain in effect (meaning thi s test can be used) for the duration of  the COVID-19 declaration under Section 564(b)(1) of the Act, 21 U.S.C. section 360-bbb-3(b)(1), unless the authorization is terminated or revoked sooner.  Performed at [facility], [address], [place], [place]          Radiology Studies: CT ANGIO CHEST PE W OR WO CONTRAST  Addendum Date: 05/31/2020   ADDENDUM REPORT: 05/31/2020 17:48 ADDENDUM: These results were called by telephone at the time of interpretation on 05/31/2020 at 5:41pm to provider Aliah Eriksson , who verbally acknowledged these results. Electronically Signed   By:  06/02/2020 M.D.   On: 05/31/2020 17:48   Result Date: 05/31/2020 CLINICAL DATA:  COVID 19 positive, 15 L O2, shortness of breath. High probability of PE. EXAM: CT ANGIOGRAPHY CHEST WITH CONTRAST TECHNIQUE: Multidetector CT imaging of the chest was performed using the standard protocol during bolus administration of intravenous contrast. Multiplanar CT image reconstructions and MIPs were obtained to evaluate the vascular anatomy. CONTRAST:  06/02/2020 OMNIPAQUE IOHEXOL 350 MG/ML SOLN COMPARISON:  CT chest 08/28/2011 report without images. FINDINGS: Cardiovascular: Satisfactory opacification of the pulmonary arteries to the segmental level. Limited evaluation of the subsegmental level. Difficult to discern hypodensity within the right lower lobe segmental and subsegmental pulmonary arteries may represent pulmonary embolus (8:44, 7:76). The main pulmonary artery is enlarged in size measuring up 3.2 cm. Normal heart size. No pericardial effusion. Normal ventricular ratio, no definite straightening or paradoxical bowing of the interventricular septum, no retrograde reflux of intravenous contrast within the inferior vena cava and hepatic veins. Mediastinum/Nodes: No enlarged mediastinal, hilar, or axillary lymph nodes. Thyroid gland, trachea, and esophagus demonstrate no significant findings. Lungs/Pleura: Extensive diffuse peribronchovascular ground-glass and consolidative opacities. Largely spared the subpleural space. No pleural effusion. No pneumothorax. No findings to suggest pulmonary infarction. Upper Abdomen: Cholelithiasis with no gallbladder wall thickening or pericholecystic fluid. No acute abnormality. Musculoskeletal: No chest wall abnormality. No suspicious lytic or blastic osseous lesions. Multilevel degenerative change of the spine. No acute displaced fracture. Review of the MIP images confirms the above findings. IMPRESSION: 1. Possible segmental and subsegmental right lower lobe pulmonary embolus.  Evaluation is markedly limited due to streak artifact and findings may just be artifactual. No right heart strain or pulmonary infarction. 2. Extensive multifocal pneumonia consistent with COVID-19 infection. Electronically Signed: By: 08/30/2011 M.D. On: 05/31/2020 17:39  ECHOCARDIOGRAM COMPLETE  Result Date: 06/01/2020    ECHOCARDIOGRAM REPORT   Patient Name:   EMIR NACK Date of Exam: 06/01/2020 Medical Rec #:  [id number]     Height:       69.0 in Accession #:    [id number]    Weight:       240.0 lb Date of Birth:  04/16/1971     BSA:          2.232 m Patient Age:    49 years      BP:           131/91 mmHg Patient Gender: M             HR:           76 bpm. Exam Location:  Inpatient Procedure: 2D Echo, Cardiac Doppler and Color Doppler Indications:    I50.23 Acute on chronic systolic (congestive) heart failure  History:        Patient has no prior history of Echocardiogram examinations.                 COVID-19 Postive.  Sonographer:    Elmarie Shiley Dance Referring Phys: [id number] Izabelle Daus J Yalonda Sample IMPRESSIONS  1. Left ventricular ejection fraction, by estimation, is 60 to 65%. The left ventricle has normal function. The left ventricle has no regional wall motion abnormalities. There is moderate concentric left ventricular hypertrophy. Left ventricular diastolic parameters are indeterminate.  2. Right ventricular systolic function is normal. The right ventricular size is normal. Tricuspid regurgitation signal is inadequate for assessing PA pressure.  3. The mitral valve is normal in structure. No evidence of mitral valve regurgitation. No evidence of mitral stenosis.  4. The aortic valve is tricuspid. Aortic valve regurgitation is not visualized. Mild aortic valve sclerosis is present, with no evidence of aortic valve stenosis.  5. The inferior vena cava is normal in size with greater than 50% respiratory variability, suggesting right atrial pressure of 3 mmHg. FINDINGS  Left Ventricle: Left ventricular ejection  fraction, by estimation, is 60 to 65%. The left ventricle has normal function. The left ventricle has no regional wall motion abnormalities. The left ventricular internal cavity size was normal in size. There is  moderate concentric left ventricular hypertrophy. Left ventricular diastolic parameters are indeterminate. Normal left ventricular filling pressure. Right Ventricle: The right ventricular size is normal. No increase in right ventricular wall thickness. Right ventricular systolic function is normal. Tricuspid regurgitation signal is inadequate for assessing PA pressure. Left Atrium: Left atrial size was normal in size. Right Atrium: Right atrial size was normal in size. Pericardium: There is no evidence of pericardial effusion. Mitral Valve: The mitral valve is normal in structure. No evidence of mitral valve regurgitation. No evidence of mitral valve stenosis. Tricuspid Valve: The tricuspid valve is normal in structure. Tricuspid valve regurgitation is not demonstrated. No evidence of tricuspid stenosis. Aortic Valve: The aortic valve is tricuspid. Aortic valve regurgitation is not visualized. Mild aortic valve sclerosis is present, with no evidence of aortic valve stenosis. Pulmonic Valve: The pulmonic valve was normal in structure. Pulmonic valve regurgitation is trivial. No evidence of pulmonic stenosis. Aorta: The aortic root is normal in size and structure. Venous: The inferior vena cava is normal in size with greater than 50% respiratory variability, suggesting right atrial pressure of 3 mmHg. IAS/Shunts: No atrial level shunt detected by color flow Doppler.  LEFT VENTRICLE PLAX 2D LVIDd:         5.40 cm  Diastology LVIDs:  2.90 cm  LV e' medial:    6.31 cm/s LV PW:         1.30 cm  LV E/e' medial:  11.3 LV IVS:        1.30 cm  LV e' lateral:   9.03 cm/s LVOT diam:     2.10 cm  LV E/e' lateral: 7.9 LV SV:         65 LV SV Index:   29 LVOT Area:     3.46 cm  RIGHT VENTRICLE             IVC RV  Basal diam:  2.30 cm     IVC diam: 1.90 cm RV S prime:     13.70 cm/s TAPSE (M-mode): 2.2 cm LEFT ATRIUM             Index       RIGHT ATRIUM           Index LA diam:        4.70 cm 2.11 cm/m  RA Area:     11.10 cm LA Vol (A2C):   59.1 ml 26.47 ml/m RA Volume:   21.60 ml  9.68 ml/m LA Vol (A4C):   52.5 ml 23.52 ml/m LA Biplane Vol: 57.7 ml 25.85 ml/m  AORTIC VALVE LVOT Vmax:   85.50 cm/s LVOT Vmean:  56.500 cm/s LVOT VTI:    0.187 m  AORTA Ao Root diam: 3.00 cm Ao Asc diam:  3.20 cm MITRAL VALVE MV Area (PHT): 3.03 cm    SHUNTS MV Decel Time: 250 msec    Systemic VTI:  0.19 m MV E velocity: 71.00 cm/s  Systemic Diam: 2.10 cm MV A velocity: 60.90 cm/s MV E/A ratio:  1.17 Armanda Magicraci Turner MD Electronically signed by Armanda Magicraci Turner MD Signature Date/Time: 06/01/2020/11:05:13 AM    Final    VAS US LOWER EXTREMITY VENOUS (DVT)  Result Date: 06/02/2020  Lower Venous DVTStudy Indications: Ddimer elevated.  Comparison Study: no prior Performing Technologist: Blanch MediaMegan Riddle RVS  Examination Guidelines: A complete evaluation includes B-mode imaging, spectral Doppler, color Doppler, and power Doppler as needed of all accessible portions of each vessel. Bilateral testing is considered an integral part of a complete examination. Limited examinations for reoccurring indications may be performed as noted. The reflux portion of the exam is performed with the patient in reverse Trendelenburg.  +---------+---------------+---------+-----------+----------+--------------+  RIGHT     Compressibility Phasicity Spontaneity Properties Thrombus Aging  +---------+---------------+---------+-----------+----------+--------------+  CFV       Full            Yes       Yes                                    +---------+---------------+---------+-----------+----------+--------------+  SFJ       Full                                                             +---------+---------------+---------+-----------+----------+--------------+  FV Prox   Full                                                              +---------+---------------+---------+-----------+----------+--------------+  FV Mid    Full                                                             +---------+---------------+---------+-----------+----------+--------------+  FV Distal Full                                                             +---------+---------------+---------+-----------+----------+--------------+  PFV       Full                                                             +---------+---------------+---------+-----------+----------+--------------+  POP       Full            Yes       Yes                                    +---------+---------------+---------+-----------+----------+--------------+  PTV       Full                                                             +---------+---------------+---------+-----------+----------+--------------+  PERO      Full                                                             +---------+---------------+---------+-----------+----------+--------------+   +---------+---------------+---------+-----------+----------+--------------+  LEFT      Compressibility Phasicity Spontaneity Properties Thrombus Aging  +---------+---------------+---------+-----------+----------+--------------+  CFV       Full            Yes       Yes                                    +---------+---------------+---------+-----------+----------+--------------+  SFJ       Full                                                             +---------+---------------+---------+-----------+----------+--------------+  FV Prox   Full                                                             +---------+---------------+---------+-----------+----------+--------------+  FV Mid    Full                                                             +---------+---------------+---------+-----------+----------+--------------+  FV Distal Full                                                              +---------+---------------+---------+-----------+----------+--------------+  PFV       Full                                                             +---------+---------------+---------+-----------+----------+--------------+  POP       Full            Yes       Yes                                    +---------+---------------+---------+-----------+----------+--------------+  PTV       Full                                                             +---------+---------------+---------+-----------+----------+--------------+  PERO      Full                                                             +---------+---------------+---------+-----------+----------+--------------+     Summary: BILATERAL: - No evidence of deep vein thrombosis seen in the lower extremities, bilaterally. - No evidence of superficial venous thrombosis in the lower extremities, bilaterally. -   *See table(s) above for measurements and observations. Electronically signed by Lemar Livings MD on 06/02/2020 at 11:05:04 AM.    Final         Scheduled Meds:  baricitinib  4 mg Oral Daily   enoxaparin (LOVENOX) injection  110 mg Subcutaneous Q12H   insulin aspart  0-9 Units Subcutaneous TID WC   Ipratropium-Albuterol  1 puff Inhalation Q6H   lisinopril  2.5 mg Oral Daily   methylPREDNISolone (SOLU-MEDROL) injection  60 mg Intravenous Q12H   Continuous Infusions:    LOS: 5 days    Time spent:40 min    Hill Mackie, Roselind Messier, MD [facility] Pager [phone]  If 7PM-7AM, please contact night-coverage [email or web address] Password [email or web address] 06/02/2020, 1:45 PM

## 2020-06-02 NOTE — Progress Notes (Signed)
Patient is on 7L HFNC at 95%. He desats while eating but his O2 comes back up mid-90's easily. Patient did well today.

## 2020-06-03 LAB — COMPREHENSIVE METABOLIC PANEL
ALT: 35 U/L (ref 0–44)
AST: 13 U/L — ABNORMAL LOW (ref 15–41)
Albumin: 3 g/dL — ABNORMAL LOW (ref 3.5–5.0)
Alkaline Phosphatase: 53 U/L (ref 38–126)
Anion gap: 12 (ref 5–15)
BUN: 33 mg/dL — ABNORMAL HIGH (ref 6–20)
CO2: 29 mmol/L (ref 22–32)
Calcium: 9.1 mg/dL (ref 8.9–10.3)
Chloride: 96 mmol/L — ABNORMAL LOW (ref 98–111)
Creatinine, Ser: 0.88 mg/dL (ref 0.61–1.24)
GFR calc Af Amer: >60 mL/min (ref >60)
GFR calc non Af Amer: >60 mL/min (ref >60)
Glucose, Bld: 234 mg/dL — ABNORMAL HIGH (ref 70–99)
Potassium: 5 mmol/L (ref 3.5–5.1)
Sodium: 137 mmol/L (ref 135–145)
Total Bilirubin: 0.7 mg/dL (ref 0.3–1.2)
Total Protein: 6.7 g/dL (ref 6.5–8.1)

## 2020-06-03 LAB — FERRITIN: Ferritin: 990 ng/mL — ABNORMAL HIGH (ref 24–336)

## 2020-06-03 LAB — CBC WITH DIFFERENTIAL/PLATELET
Abs Immature Granulocytes: 0.9 10*3/uL — ABNORMAL HIGH (ref 0.00–0.07)
Basophils Absolute: 0.1 10*3/uL (ref 0.0–0.1)
Basophils Relative: 1 %
Eosinophils Absolute: 0 10*3/uL (ref 0.0–0.5)
Eosinophils Relative: 0 %
HCT: 44.6 % (ref 39.0–52.0)
Hemoglobin: 14.3 g/dL (ref 13.0–17.0)
Immature Granulocytes: 6 %
Lymphocytes Relative: 6 %
Lymphs Abs: 0.8 10*3/uL (ref 0.7–4.0)
MCH: 28.5 pg (ref 26.0–34.0)
MCHC: 32.1 g/dL (ref 30.0–36.0)
MCV: 89 fL (ref 80.0–100.0)
Monocytes Absolute: 0.5 10*3/uL (ref 0.1–1.0)
Monocytes Relative: 4 %
Neutro Abs: 12 10*3/uL — ABNORMAL HIGH (ref 1.7–7.7)
Neutrophils Relative %: 83 %
Platelets: 265 10*3/uL (ref 150–400)
RBC: 5.01 MIL/uL (ref 4.22–5.81)
RDW: 12.7 % (ref 11.5–15.5)
WBC: 14.3 10*3/uL — ABNORMAL HIGH (ref 4.0–10.5)
nRBC: 0 % (ref 0.0–0.2)

## 2020-06-03 LAB — C-REACTIVE PROTEIN: CRP: 1.1 mg/dL — ABNORMAL HIGH (ref <1.0)

## 2020-06-03 LAB — GLUCOSE, CAPILLARY
Glucose-Capillary: 187 mg/dL — ABNORMAL HIGH (ref 70–99)
Glucose-Capillary: 279 mg/dL — ABNORMAL HIGH (ref 70–99)
Glucose-Capillary: 179 mg/dL — ABNORMAL HIGH (ref 70–99)
Glucose-Capillary: 253 mg/dL — ABNORMAL HIGH (ref 70–99)

## 2020-06-03 LAB — PHOSPHORUS: Phosphorus: 4.8 mg/dL — ABNORMAL HIGH (ref 2.5–4.6)

## 2020-06-03 LAB — MAGNESIUM: Magnesium: 2.4 mg/dL (ref 1.7–2.4)

## 2020-06-03 LAB — LACTATE DEHYDROGENASE: LDH: 206 U/L — ABNORMAL HIGH (ref 98–192)

## 2020-06-03 LAB — D-DIMER, QUANTITATIVE: D-Dimer, Quant: 2.66 ug/mL-FEU — ABNORMAL HIGH (ref 0.00–0.50)

## 2020-06-03 MED ORDER — APIXABAN 5 MG PO TABS
10.0000 mg | ORAL_TABLET | Freq: Two times a day (BID) | ORAL | Status: DC
  Administered 2020-06-03 – 2020-06-05 (×4): 10 mg via ORAL
  Filled 2020-06-03: qty 2
  Filled 2020-06-03: qty 4
  Filled 2020-06-03 (×2): qty 2

## 2020-06-03 MED ORDER — DEXAMETHASONE 4 MG PO TABS
6.0000 mg | ORAL_TABLET | Freq: Every day | ORAL | Status: DC
  Administered 2020-06-03 – 2020-06-05 (×3): 6 mg via ORAL
  Filled 2020-06-03 (×3): qty 2

## 2020-06-03 MED ORDER — APIXABAN 5 MG PO TABS: 5.0000 mg | ORAL_TABLET | Freq: Two times a day (BID) | ORAL | Status: DC

## 2020-06-03 NOTE — Progress Notes (Signed)
ANTICOAGULATION CONSULT NOTE - Initial Consult  Pharmacy Consult for Therapeutic LMWH >> Eliquis Indication: pulmonary embolus  No Known Allergies  Patient Measurements: Height: 5\' 9"  (175.3 cm) Weight: 108.9 kg (240 lb) IBW/kg (Calculated) : 70.7  Vital Signs: Temp: 98.3 F (36.8 C) (09/20 1409) Temp Source: Oral (09/20 1409) BP: 132/78 (09/20 1409) Pulse Rate: 83 (09/20 1409)  Labs: Recent Labs    06/01/20 0422 06/01/20 0422 06/02/20 0530 06/03/20 0512  HGB 13.6   < > 14.2 14.3  HCT 42.1  --  43.2 44.6  PLT 201  --  253 265  CREATININE 0.83  --  0.78 0.88   < > = values in this interval not displayed.    Estimated Creatinine Clearance: 123.5 mL/min (by C-G formula based on SCr of 0.88 mg/dL).   Medical History: History reviewed. No pertinent past medical history.  Medications: Pt not taking anticoagulants PTA  Assessment: Pt is a 89 yoM admitted with COVID-19 PNA. CTA on 9/17 reveals possible right lower lobe PE. Started on enoxaparin 0.5mg /kg q12h starting 9/14, then increased to 1mg /kg q12h on 9/17.  Pharmacy now consulted to transition to apixaban.  Today, 06/03/20  CBC: WNL, stable  D-dimer trending down: >20 --> 2.66  SCr 0.88, CrCl >30 mL/min  Last dose of enoxaparin at 06:35  Goal of Therapy:  Monitor platelets by anticoagulation protocol: Yes   Plan:   Start apixaban 10mg  PO bid x 4 days to complete 7 days, then 5mg  PO bid  Monitor CBC and renal function  Follow for transition to PO anticoagulation  10/17, PharmD, BCPS Pharmacy: [phone] 06/03/2020,5:05 PM

## 2020-06-03 NOTE — Progress Notes (Signed)
ANTICOAGULATION CONSULT NOTE - Initial Consult  Pharmacy Consult for Therapeutic LMWH Indication: pulmonary embolus  No Known Allergies  Patient Measurements: Height: 5\' 9"  (175.3 cm) Weight: 108.9 kg (240 lb) IBW/kg (Calculated) : 70.7  Vital Signs: Temp: 97.7 F (36.5 C) (09/20 0446) BP: 122/79 (09/20 0446) Pulse Rate: 58 (09/20 0446)  Labs: Recent Labs    06/01/20 0422 06/01/20 0422 06/02/20 0530 06/03/20 0512  HGB 13.6   < > 14.2 14.3  HCT 42.1  --  43.2 44.6  PLT 201  --  253 265  CREATININE 0.83  --  0.78 0.88   < > = values in this interval not displayed.    Estimated Creatinine Clearance: 123.5 mL/min (by C-G formula based on SCr of 0.88 mg/dL).   Medical History: History reviewed. No pertinent past medical history.  Medications: Pt not taking anticoagulants PTA  Assessment: Pt is a 65 yoM admitted with COVID-19 PNA. CTA on 9/17 reveals possible right lower lobe PE. Pharmacy consulted to dose enoxaparin.   Today, 06/03/20  CBC: WNL, stable  D-dimer trending down: >20 --> 2.66  SCr 0.88, CrCl >30 mL/min  Previously on enoxaparin 0.5 mg/kg subQ q24h for DVT ppx  Goal of Therapy:  Monitor platelets by anticoagulation protocol: Yes   Plan:   Continue enoxaparin 1 mg/kg (110 mg) subQ q12h  Monitor CBC and renal function  Follow for transition to PO anticoagulation  06/05/20, PharmD 06/03/2020,9:51 AM

## 2020-06-03 NOTE — Progress Notes (Signed)
Inpatient Diabetes Program Recommendations  AACE/ADA: New Consensus Statement on Inpatient Glycemic Control (2015)  Target Ranges:  Prepandial:   less than 140 mg/dL      Peak postprandial:   less than 180 mg/dL (1-2 hours)      Critically ill patients:  140 - 180 mg/dL   Lab Results  Component Value Date   GLUCAP 187 (H) 06/03/2020   HGBA1C 6.4 (H) 05/28/2020    Review of Glycemic Control  Diabetes history: None, has pre-diabetes HgbA1C - 6.4% Outpatient Diabetes medications: Nonw Current orders for Inpatient glycemic control: Levemir 10 units bid, Novolog 0-9 units tidwc.  Blood sugars trending above goal in 200s.  Needs more insulin.  Inpatient Diabetes Program Recommendations:     Increase Novolog to 0-15 units tidwc and hs Add meal coverage - Novolog 4 units tidwc Tradjenta 5 mg QD CHO mod med diet  Will need to f/u with PCP regarding pre-diabetes.  Thank you. Ailene Ards, RD, LDN, CDE Inpatient Diabetes Coordinator [phone]

## 2020-06-03 NOTE — Progress Notes (Signed)
PROGRESS NOTE    Sean Reynolds  ZOX:[id number] DOB: 1971-03-31 DOA: 05/28/2020 PCP: [facility]     Brief Narrative:  Sean Reynolds is a 49 y.o. WM PMHx no significant medical history   who presents with concerns of increasing shortness of breath.  About 10 days ago he was at a pool party and the following day felt like his face was burning and had increasing fatigue and lethargy.  Had chills but no objective fever.  Had frequent productive cough associated with nausea and chest discomfort. Has loss of taste and smell. Denies any vomiting or diarrhea.  He then presented to urgent care about 4 days ago and was found to be Covid positive.  Also later realized that his sister and brother-in-law who were at the pool party also were positive for Covid.  Today he began to feel more shortness of breath and felt like he almost passed out on his way into the ER.  Patient denies any tobacco or illicit drug use.  Has occasional alcohol use.  ED Course: He was febrile up to 100.6, tachycardic and tachypneic and was initially found to be at 50% oxygen saturation on room air.  Initially placed on nonrebreather and was able to titrate back down to 6 L. CBC unremarkable.  Lactic acid of 1.8.  CMP with mild elevated AST and ALT.  Procalcitonin ambiguous at 0.29.  CRP of 20.7.   Subjective: 9/20 afebrile overnight positive S OB, but continues to improve.  Negative CP, negative nausea, negative vomiting, negative abdominal pain.     Assessment & Plan: Covid vaccination;   Active Problems:   Pneumonia due to COVID-19 virus   Acute on chronic respiratory failure with hypoxia (HCC)   Hyperglycemia   Transaminitis   SIRS (systemic inflammatory response syndrome) (HCC)   Pulmonary embolism (HCC)  SIRS -On admission patient required higher for SIRS HR> 90, RR> 20  Acute respiratory failure with hypoxia/Covid pneumonia COVID-19 Labs  Recent Labs    06/01/20 0422 06/02/20 0530  06/03/20 0512  DDIMER 7.39* 3.17* 2.66*  FERRITIN 955* 982* 990*  LDH 294* 206* 206*  CRP 1.7* 1.5* 1.1*    Lab Results  Component Value Date   SARSCOV2NAA POSITIVE (A) 05/28/2020  -Baricitinib per pharmacy protocol x14 days -Solu-Medrol 60 mg BID x10 days -9/20 decrease Solu-Medrol to Decadron 6 mg daily -Remdesivir per pharmacy protocol x5 days -Respimat QID -Incentive spirometry -Flutter valve (on back order) -Titrate O2 to maintain SPO2> 88%  Prediabetes -9/14 hemoglobin A1c= 6.4 this meets criteria for prediabetes. -9/19 Levemir 10 units BID -Sensitive SSI  HLD -9/16 LDL= 180 -We will hold statin during Covid treatment but would start Lipitor upon discharge.  Essential HTN -9/17 increase lisinopril 5 mg daily -9/17 hydralazine IV prn -9/18 BP better controlled today  Mild transaminitis AST of 46 and ALT of 52 Secondary to Covid infection.  Continue to monitor daily.  Acute PE --Increaseing O2 demand PE/DVT? --CTA PE protocol positive acute PE --Lower extremity Doppler; negative DVT see results below  -9/17 echocardiogram; negative evidence of right heart strain see results below  -9/20 Eliquis per pharmacy protocol  Hyperkalemia -9/19 Lokelma 10 g x 1   DVT prophylaxis: Lovenox---> Eliquis Code Status: Full  Family Communication:  Status is: Inpatient    Dispo: The patient is from: Home              Anticipated d/c is to: Home  Anticipated d/c date is: 9/20              Patient currently unstable      Consultants:    Procedures/Significant Events:  9/17 CTA chest PE protocol;Possible segmental and subsegmental right lower lobe pulmonary embolus. Evaluation is markedly limited due to streak artifact and findings may just be artifactual. No right heart strain or pulmonary infarction. 2. Extensive multifocal pneumonia consistent with COVID-19 Infection. 9/18 bilateral lower extremity Doppler; negative DVT bilateral 9/18  echocardiogram;Left Ventricle: LVEF = 60 to 65%.    I have personally reviewed and interpreted all radiology studies and my findings are as above.  VENTILATOR SETTINGS: Nasal cannula 9/20 Flow; SPO2 98%   Cultures   Antimicrobials: Anti-infectives (From admission, onward)   Start     Ordered Stop   05/29/20 1000  remdesivir 100 mg in sodium chloride 0.9 % 100 mL IVPB       "Followed by" Linked Group Details   05/28/20 1708 06/02/20 0959   05/28/20 1800  remdesivir 200 mg in sodium chloride 0.9% 250 mL IVPB       "Followed by" Linked Group Details   05/28/20 1708 05/28/20 2055       Devices    LINES / TUBES:      Continuous Infusions:    Objective: Vitals:   06/02/20 0444 06/02/20 1321 06/02/20 1930 06/03/20 0446  BP: 110/68 122/81 131/78 122/79  Pulse: (!) 58 85 87 (!) 58  Resp: (!) 22 18 (!) 21 (!) 23  Temp: 97.7 F (36.5 C) 98.4 F (36.9 C) 98.3 F (36.8 C) 97.7 F (36.5 C)  TempSrc:  Oral Oral   SpO2: 100% 98% 100% 99%  Weight:      Height:        Intake/Output Summary (Last 24 hours) at 06/03/2020 1208 Last data filed at 06/03/2020 1005 Gross per 24 hour  Intake 1440 ml  Output --  Net 1440 ml   Filed Weights   05/28/20 2100  Weight: 108.9 kg   Physical Exam:  General: A/O x4, positive acute respiratory distress Eyes: negative scleral hemorrhage, negative anisocoria, negative icterus ENT: Negative Runny nose, negative gingival bleeding, Neck:  Negative scars, masses, torticollis, lymphadenopathy, JVD Lungs: decreased breath sounds bilaterally without wheezes or crackles Cardiovascular: Regular rate and rhythm without murmur gallop or rub normal S1 and S2 Abdomen: negative abdominal pain, nondistended, positive soft, bowel sounds, no rebound, no ascites, no appreciable mass Extremities: No significant cyanosis, clubbing, or edema bilateral lower extremities Skin: Negative rashes, lesions, ulcers Psychiatric:  Negative depression, negative  anxiety, negative fatigue, negative mania  Central nervous system:  Cranial nerves II through XII intact, tongue/uvula midline, all extremities muscle strength 5/5, sensation intact throughout, negative dysarthria, negative expressive aphasia, negative receptive aphasia. .     Data Reviewed: Care during the described time interval was provided by me .  I have reviewed this patient's available data, including medical history, events of note, physical examination, and all test results as part of my evaluation.  CBC: Recent Labs  Lab 05/30/20 0235 05/31/20 0354 06/01/20 0422 06/02/20 0530 06/03/20 0512  WBC 10.7* 14.5* 14.2* 14.0* 14.3*  NEUTROABS 9.3* 11.3* 11.2* 11.4* 12.0*  HGB 12.8* 13.5 13.6 14.2 14.3  HCT 39.1 41.3 42.1 43.2 44.6  MCV 89.5 90.2 89.2 88.3 89.0  PLT 261 227 201 253 265   Basic Metabolic Panel: Recent Labs  Lab 05/30/20 0235 05/31/20 0354 06/01/20 0422 06/02/20 0530 06/03/20 0512  NA 137 138  136 135 137  K 4.3 5.0 5.3* 4.8 5.0  CL 100 101 101 96* 96*  CO2 25 28 24 29 29   GLUCOSE 253* 266* 245* 251* 234*  BUN 26* 28* 26* 27* 33*  CREATININE 0.81 0.92 0.83 0.78 0.88  CALCIUM 8.5* 8.5* 8.4* 8.7* 9.1  MG 2.5* 2.2 2.3 2.5* 2.4  PHOS 3.4 4.7* 4.8* 5.1* 4.8*   GFR: Estimated Creatinine Clearance: 123.5 mL/min (by C-G formula based on SCr of 0.88 mg/dL). Liver Function Tests: Recent Labs  Lab 05/30/20 0235 05/31/20 0354 06/01/20 0422 06/02/20 0530 06/03/20 0512  AST 34 35 22 12* 13*  ALT 58* 63* 52* 41 35  ALKPHOS 75 66 59 60 53  BILITOT 0.7 0.7 0.8 0.9 0.7  PROT 7.3 7.1 6.6 6.9 6.7  ALBUMIN 2.9* 2.9* 3.0* 3.2* 3.0*   No results for input(s): LIPASE, AMYLASE in the last 168 hours. No results for input(s): AMMONIA in the last 168 hours. Coagulation Profile: No results for input(s): INR, PROTIME in the last 168 hours. Cardiac Enzymes: No results for input(s): CKTOTAL, CKMB, CKMBINDEX, TROPONINI in the last 168 hours. BNP (last 3 results) No  results for input(s): PROBNP in the last 8760 hours. HbA1C: No results for input(s): HGBA1C in the last 72 hours. CBG: Recent Labs  Lab 06/02/20 1144 06/02/20 1615 06/02/20 2053 06/03/20 0753 06/03/20 1143  GLUCAP 288* 258* 275* 187* 279*   Lipid Profile: No results for input(s): CHOL, HDL, LDLCALC, TRIG, CHOLHDL, LDLDIRECT in the last 72 hours. Thyroid Function Tests: No results for input(s): TSH, T4TOTAL, FREET4, T3FREE, THYROIDAB in the last 72 hours. Anemia Panel: Recent Labs    06/02/20 0530 06/03/20 0512  FERRITIN 982* 990*   Sepsis Labs: Recent Labs  Lab 05/28/20 1427 05/28/20 1837  PROCALCITON 0.29  --   LATICACIDVEN 1.8 1.6    Recent Results (from the past 240 hour(s))  Blood Culture (routine x 2)     Status: None   Collection Time: 05/28/20  2:25 PM   Specimen: BLOOD LEFT HAND  Result Value Ref Range Status   Specimen Description   Final    BLOOD LEFT HAND Performed at [facility], [address] [place], [place]    Special Requests   Final    BOTTLES DRAWN AEROBIC AND ANAEROBIC Blood Culture results may not be optimal due to an inadequate volume of blood received in culture bottles Performed at [facility], [address], [place], [place]    Culture   Final    NO GROWTH 5 DAYS Performed at [facility], [address], [place], [place]    Report Status 06/02/2020 FINAL  Final  Blood Culture (routine x 2)     Status: None   Collection Time: 05/28/20  2:27 PM   Specimen: BLOOD  Result Value Ref Range Status   Specimen Description   Final    BLOOD RIGHT ANTECUBITAL Performed at [facility], [address], [place], [place]    Special Requests   Final    BOTTLES DRAWN AEROBIC AND ANAEROBIC Blood Culture adequate volume Performed at [facility], [address] [place], [place]    Culture   Final    NO GROWTH 5  DAYS Performed at [facility], [address] [place], [place]    Report Status 06/02/2020 FINAL  Final  SARS Coronavirus 2 by RT PCR (hospital order,  performed [facility]) Nasopharyngeal Nasopharyngeal Swab     Status: Abnormal   Collection Time: 05/28/20  3:59 PM   Specimen: Nasopharyngeal Swab  Result Value Ref Range Status   SARS Coronavirus 2 POSITIVE (A) NEGATIVE Final    Comment: RESULT CALLED TO, READ BACK BY AND VERIFIED WITH: WEST,S. RN @1747  05/28/20 BILLINGSLEY,L (NOTE) SARS-CoV-2 target nucleic acids are DETECTED  SARS-CoV-2 RNA is generally detectable in upper respiratory specimens  during the acute phase of infection.  Positive results are indicative  of the presence of the identified virus, but do not rule out bacterial infection or co-infection with other pathogens not detected by the test.  Clinical correlation with patient history and  other diagnostic information is necessary to determine patient infection status.  The expected result is negative.  Fact Sheet for Patients:   BoilerBrush.com.cyhttps://www.fda.gov/media/136312/download   Fact Sheet for Healthcare Providers:   https://pope.com/https://www.fda.gov/media/136313/download    This test is not yet approved or cleared by the [place] FDA and  has been authorized for detection and/or diagnosis of SARS-CoV-2 by FDA under an Emergency Use Authorization (EUA).  This EUA will remain in effect (meaning thi s test can be used) for the duration of  the COVID-19 declaration under Section 564(b)(1) of the Act, 21 U.S.C. section 360-bbb-3(b)(1), unless the authorization is terminated or revoked sooner.  Performed at [facility], [address] [place], [place] [address]          Radiology Studies: No results found.      Scheduled Meds: . baricitinib  4 mg Oral Daily  . enoxaparin (LOVENOX) injection  110 mg Subcutaneous Q12H  . insulin aspart  0-9 Units Subcutaneous TID  WC  . insulin detemir  10 Units Subcutaneous BID  . Ipratropium-Albuterol  1 puff Inhalation Q6H  . lisinopril  2.5 mg Oral Daily  . methylPREDNISolone (SOLU-MEDROL) injection  60 mg Intravenous Q12H   Continuous Infusions:    LOS: 6 days    Time spent:40 min    Yeshaya Vath, Roselind MessierURTIS J, MD [facility] Pager [phone]  If 7PM-7AM, please contact night-coverage [email or web address] Password [email or web address] 06/03/2020, 12:08 PM

## 2020-06-04 DIAGNOSIS — J9601 Acute respiratory failure with hypoxia: Secondary | ICD-10-CM

## 2020-06-04 DIAGNOSIS — R7401 Elevation of levels of liver transaminase levels: Secondary | ICD-10-CM

## 2020-06-04 DIAGNOSIS — R739 Hyperglycemia, unspecified: Secondary | ICD-10-CM

## 2020-06-04 DIAGNOSIS — R6510 Systemic inflammatory response syndrome (SIRS) of non-infectious origin without acute organ dysfunction: Secondary | ICD-10-CM

## 2020-06-04 DIAGNOSIS — J9621 Acute and chronic respiratory failure with hypoxia: Secondary | ICD-10-CM

## 2020-06-04 DIAGNOSIS — J1282 Pneumonia due to coronavirus disease 2019: Secondary | ICD-10-CM

## 2020-06-04 DIAGNOSIS — I2601 Septic pulmonary embolism with acute cor pulmonale: Secondary | ICD-10-CM

## 2020-06-04 DIAGNOSIS — I2690 Septic pulmonary embolism without acute cor pulmonale: Secondary | ICD-10-CM

## 2020-06-04 LAB — FERRITIN: Ferritin: 1178 ng/mL — ABNORMAL HIGH (ref 24–336)

## 2020-06-04 LAB — LACTATE DEHYDROGENASE: LDH: 186 U/L (ref 98–192)

## 2020-06-04 LAB — GLUCOSE, CAPILLARY
Glucose-Capillary: 130 mg/dL — ABNORMAL HIGH (ref 70–99)
Glucose-Capillary: 215 mg/dL — ABNORMAL HIGH (ref 70–99)
Glucose-Capillary: 182 mg/dL — ABNORMAL HIGH (ref 70–99)
Glucose-Capillary: 251 mg/dL — ABNORMAL HIGH (ref 70–99)

## 2020-06-04 LAB — PHOSPHORUS: Phosphorus: 5.2 mg/dL — ABNORMAL HIGH (ref 2.5–4.6)

## 2020-06-04 LAB — MAGNESIUM: Magnesium: 3.1 mg/dL — ABNORMAL HIGH (ref 1.7–2.4)

## 2020-06-04 LAB — D-DIMER, QUANTITATIVE: D-Dimer, Quant: 2.19 ug/mL-FEU — ABNORMAL HIGH (ref 0.00–0.50)

## 2020-06-04 NOTE — Progress Notes (Signed)
SATURATION QUALIFICATIONS: (This note is used to comply with regulatory documentation for home oxygen)  Patient Saturations on Room Air at Rest = 84%  Patient Saturations on Room Air while Ambulating = 79%  Patient Saturations on 3 Liters of oxygen while Ambulating = 89%  Please briefly explain why patient needs home oxygen:

## 2020-06-04 NOTE — Progress Notes (Signed)
PROGRESS NOTE    Sean Reynolds  WUJ:[id number] DOB: 26-Mar-1971 DOA: 05/28/2020 PCP: [facility]     Brief Narrative:  Sean Reynolds is a 49 y.o. WM PMHx no significant medical history   who presents with concerns of increasing shortness of breath.  About 10 days ago he was at a pool party and the following day felt like his face was burning and had increasing fatigue and lethargy.  Had chills but no objective fever.  Had frequent productive cough associated with nausea and chest discomfort. Has loss of taste and smell. Denies any vomiting or diarrhea.  He then presented to urgent care about 4 days ago and was found to be Covid positive.  Also later realized that his sister and brother-in-law who were at the pool party also were positive for Covid.  Today he began to feel more shortness of breath and felt like he almost passed out on his way into the ER.  Patient denies any tobacco or illicit drug use.  Has occasional alcohol use.  ED Course: He was febrile up to 100.6, tachycardic and tachypneic and was initially found to be at 50% oxygen saturation on room air.  Initially placed on nonrebreather and was able to titrate back down to 6 L. CBC unremarkable.  Lactic acid of 1.8.  CMP with mild elevated AST and ALT.  Procalcitonin ambiguous at 0.29.  CRP of 20.7.   Subjective: 9/21 afebrile overnight A/O x4, positive S OB, negative nausea, negative vomiting, negative abdominal pain.      Assessment & Plan: Covid vaccination;   Active Problems:   Pneumonia due to COVID-19 virus   Acute on chronic respiratory failure with hypoxia (HCC)   Hyperglycemia   Transaminitis   SIRS (systemic inflammatory response syndrome) (HCC)   Pulmonary embolism (HCC)  SIRS -On admission patient required higher for SIRS HR> 90, RR> 20  Acute respiratory failure with hypoxia/Covid pneumonia COVID-19 Labs  Recent Labs    06/02/20 0530 06/03/20 0512 06/04/20 0529  DDIMER 3.17*  2.66* 2.19*  FERRITIN 982* 990* 1,178*  LDH 206* 206* 186  CRP 1.5* 1.1*  --     Lab Results  Component Value Date   SARSCOV2NAA POSITIVE (A) 05/28/2020  -Baricitinib per pharmacy protocol x14 days -Solu-Medrol 60 mg BID x10 days -9/20 decrease Solu-Medrol to Decadron 6 mg daily -Remdesivir per pharmacy protocol x5 days -Respimat QID -Incentive spirometry -Flutter valve (on back order) -Titrate O2 to maintain SPO2> 88% -9/21 ambulatory SPO2 pending 5  Prediabetes -9/14 hemoglobin A1c= 6.4 this meets criteria for prediabetes. -9/19 Levemir 10 units BID -Sensitive SSI  HLD -9/16 LDL= 180 -We will hold statin during Covid treatment but would start Lipitor upon discharge.  Essential HTN -9/17 increase lisinopril 5 mg daily -9/17 hydralazine IV prn -9/18 BP better controlled today  Mild transaminitis AST of 46 and ALT of 52 Secondary to Covid infection.  Continue to monitor daily.  Acute PE --Increaseing O2 demand PE/DVT? --CTA PE protocol positive acute PE --Lower extremity Doppler; negative DVT see results below  -9/17 echocardiogram; negative evidence of right heart strain see results below  -9/20 Eliquis per pharmacy protocol  Hyperkalemia -9/19 Lokelma 10 g x 1   DVT prophylaxis: Eliquis Code Status: Full  Family Communication:  Status is: Inpatient    Dispo: The patient is from: Home              Anticipated d/c is to: Home  Anticipated d/c date is: 9/22              Patient currently unstable      Consultants:    Procedures/Significant Events:  9/17 CTA chest PE protocol;Possible segmental and subsegmental right lower lobe pulmonary embolus. Evaluation is markedly limited due to streak artifact and findings may just be artifactual. No right heart strain or pulmonary infarction. 2. Extensive multifocal pneumonia consistent with COVID-19 Infection. 9/18 bilateral lower extremity Doppler; negative DVT bilateral 9/18  echocardiogram;Left Ventricle: LVEF = 60 to 65%.    I have personally reviewed and interpreted all radiology studies and my findings are as above.  VENTILATOR SETTINGS: HFNC 9/22 Flow; 3 L/min SPO2 97%   Cultures   Antimicrobials: Anti-infectives (From admission, onward)   Start     Ordered Stop   05/29/20 1000  remdesivir 100 mg in sodium chloride 0.9 % 100 mL IVPB       "Followed by" Linked Group Details   05/28/20 1708 06/02/20 0959   05/28/20 1800  remdesivir 200 mg in sodium chloride 0.9% 250 mL IVPB       "Followed by" Linked Group Details   05/28/20 1708 05/28/20 2055       Devices    LINES / TUBES:      Continuous Infusions:    Objective: Vitals:   06/03/20 1322 06/03/20 1409 06/03/20 1945 06/04/20 0553  BP:  132/78 122/78 133/86  Pulse:  83 74 61  Resp:  20 19 18   Temp:  98.3 F (36.8 C) 98.6 F (37 C) 97.7 F (36.5 C)  TempSrc:  Oral Oral Oral  SpO2: 95% 96% 98% 97%  Weight:      Height:        Intake/Output Summary (Last 24 hours) at 06/04/2020 06/06/2020 Last data filed at 06/03/2020 1800 Gross per 24 hour  Intake 840 ml  Output --  Net 840 ml   Filed Weights   05/28/20 2100  Weight: 108.9 kg   Physical Exam:  General: A/O x4, positive acute respiratory distress Eyes: negative scleral hemorrhage, negative anisocoria, negative icterus ENT: Negative Runny nose, negative gingival bleeding, Neck:  Negative scars, masses, torticollis, lymphadenopathy, JVD Lungs: decreased breath sounds but significantly improved bilaterally without wheezes or crackles Cardiovascular: Regular rate and rhythm without murmur gallop or rub normal S1 and S2 Abdomen: negative abdominal pain, nondistended, positive soft, bowel sounds, no rebound, no ascites, no appreciable mass Extremities: No significant cyanosis, clubbing, or edema bilateral lower extremities Skin: Negative rashes, lesions, ulcers Psychiatric:  Negative depression, negative anxiety, negative  fatigue, negative mania  Central nervous system:  Cranial nerves II through XII intact, tongue/uvula midline, all extremities muscle strength 5/5, sensation intact throughout,negative dysarthria, negative expressive aphasia, negative receptive aphasia. .     Data Reviewed: Care during the described time interval was provided by me .  I have reviewed this patient's available data, including medical history, events of note, physical examination, and all test results as part of my evaluation.  CBC: Recent Labs  Lab 05/30/20 0235 05/31/20 0354 06/01/20 0422 06/02/20 0530 06/03/20 0512  WBC 10.7* 14.5* 14.2* 14.0* 14.3*  NEUTROABS 9.3* 11.3* 11.2* 11.4* 12.0*  HGB 12.8* 13.5 13.6 14.2 14.3  HCT 39.1 41.3 42.1 43.2 44.6  MCV 89.5 90.2 89.2 88.3 89.0  PLT 261 227 201 253 265   Basic Metabolic Panel: Recent Labs  Lab 05/30/20 0235 05/30/20 0235 05/31/20 0354 06/01/20 0422 06/02/20 0530 06/03/20 0512 06/04/20 0529  NA 137  --  138 136 135 137  --   K 4.3  --  5.0 5.3* 4.8 5.0  --   CL 100  --  101 101 96* 96*  --   CO2 25  --  28 24 29 29   --   GLUCOSE 253*  --  266* 245* 251* 234*  --   BUN 26*  --  28* 26* 27* 33*  --   CREATININE 0.81  --  0.92 0.83 0.78 0.88  --   CALCIUM 8.5*  --  8.5* 8.4* 8.7* 9.1  --   MG 2.5*   < > 2.2 2.3 2.5* 2.4 3.1*  PHOS 3.4   < > 4.7* 4.8* 5.1* 4.8* 5.2*   < > = values in this interval not displayed.   GFR: Estimated Creatinine Clearance: 123.5 mL/min (by C-G formula based on SCr of 0.88 mg/dL). Liver Function Tests: Recent Labs  Lab 05/30/20 0235 05/31/20 0354 06/01/20 0422 06/02/20 0530 06/03/20 0512  AST 34 35 22 12* 13*  ALT 58* 63* 52* 41 35  ALKPHOS 75 66 59 60 53  BILITOT 0.7 0.7 0.8 0.9 0.7  PROT 7.3 7.1 6.6 6.9 6.7  ALBUMIN 2.9* 2.9* 3.0* 3.2* 3.0*   No results for input(s): LIPASE, AMYLASE in the last 168 hours. No results for input(s): AMMONIA in the last 168 hours. Coagulation Profile: No results for input(s): INR,  PROTIME in the last 168 hours. Cardiac Enzymes: No results for input(s): CKTOTAL, CKMB, CKMBINDEX, TROPONINI in the last 168 hours. BNP (last 3 results) No results for input(s): PROBNP in the last 8760 hours. HbA1C: No results for input(s): HGBA1C in the last 72 hours. CBG: Recent Labs  Lab 06/03/20 0753 06/03/20 1143 06/03/20 1648 06/03/20 1952 06/04/20 0752  GLUCAP 187* 279* 179* 253* 130*   Lipid Profile: No results for input(s): CHOL, HDL, LDLCALC, TRIG, CHOLHDL, LDLDIRECT in the last 72 hours. Thyroid Function Tests: No results for input(s): TSH, T4TOTAL, FREET4, T3FREE, THYROIDAB in the last 72 hours. Anemia Panel: Recent Labs    06/03/20 0512 06/04/20 0529  FERRITIN 990* 1,178*   Sepsis Labs: Recent Labs  Lab 05/28/20 1427 05/28/20 1837  PROCALCITON 0.29  --   LATICACIDVEN 1.8 1.6    Recent Results (from the past 240 hour(s))  Blood Culture (routine x 2)     Status: None   Collection Time: 05/28/20  2:25 PM   Specimen: BLOOD LEFT HAND  Result Value Ref Range Status   Specimen Description   Final    BLOOD LEFT HAND Performed at [facility], [address] [place], [place]    Special Requests   Final    BOTTLES DRAWN AEROBIC AND ANAEROBIC Blood Culture results may not be optimal due to an inadequate volume of blood received in culture bottles Performed at [facility], [address] [place], [place]    Culture   Final    NO GROWTH 5 DAYS Performed at [facility], [address], [place], [place]    Report Status 06/02/2020 FINAL  Final  Blood Culture (routine x 2)     Status: None   Collection Time: 05/28/20  2:27 PM   Specimen: BLOOD  Result Value Ref Range Status   Specimen Description   Final    BLOOD RIGHT ANTECUBITAL Performed at [facility], [address] [place], [place]    Special Requests   Final    BOTTLES  DRAWN AEROBIC AND  ANAEROBIC Blood Culture adequate volume Performed at [facility], [address] [place], [place] [address]    Culture   Final    NO GROWTH 5 DAYS Performed at [facility], [address] [place], [place] [address]    Report Status 06/02/2020 FINAL  Final  SARS Coronavirus 2 by RT PCR (hospital order, performed in [facility] hospital lab) Nasopharyngeal Nasopharyngeal Swab     Status: Abnormal   Collection Time: 05/28/20  3:59 PM   Specimen: Nasopharyngeal Swab  Result Value Ref Range Status   SARS Coronavirus 2 POSITIVE (A) NEGATIVE Final    Comment: RESULT CALLED TO, READ BACK BY AND VERIFIED WITH: WEST,S. RN @1747  05/28/20 BILLINGSLEY,L (NOTE) SARS-CoV-2 target nucleic acids are DETECTED  SARS-CoV-2 RNA is generally detectable in upper respiratory specimens  during the acute phase of infection.  Positive results are indicative  of the presence of the identified virus, but do not rule out bacterial infection or co-infection with other pathogens not detected by the test.  Clinical correlation with patient history and  other diagnostic information is necessary to determine patient infection status.  The expected result is negative.  Fact Sheet for Patients:   05/30/20   Fact Sheet for Healthcare Providers:   BoilerBrush.com.cy    This test is not yet approved or cleared by the https://pope.com/ FDA and  has been authorized for detection and/or diagnosis of SARS-CoV-2 by FDA under an Emergency Use Authorization (EUA).  This EUA will remain in effect (meaning thi s test can be used) for the duration of  the COVID-19 declaration under Section 564(b)(1) of the Act, 21 U.S.C. section 360-bbb-3(b)(1), unless the authorization is terminated or revoked sooner.  Performed at [facility], [address], [place], [place]          Radiology Studies: No results  found.      Scheduled Meds: . apixaban  10 mg Oral BID   Followed by  . [START ON 06/07/2020] apixaban  5 mg Oral BID  . baricitinib  4 mg Oral Daily  . dexamethasone  6 mg Oral Daily  . insulin aspart  0-9 Units Subcutaneous TID WC  . insulin detemir  10 Units Subcutaneous BID  . Ipratropium-Albuterol  1 puff Inhalation Q6H  . lisinopril  2.5 mg Oral Daily   Continuous Infusions:    LOS: 7 days    Time spent:40 min    Madylin Fairbank, 06/09/2020, MD [facility] Pager [phone]  If 7PM-7AM, please contact night-coverage [email or web address] Password [facility] 06/04/2020, 9:39 AM

## 2020-06-04 NOTE — Discharge Instructions (Signed)
Information on my medicine - ELIQUIS (apixaban)  Why was Eliquis prescribed for you? Eliquis was prescribed to treat blood clots that may have been found in the veins of your legs (deep vein thrombosis) or in your lungs (pulmonary embolism) and to reduce the risk of them occurring again.  What do You need to know about Eliquis ? The starting dose is 10 mg (two 5 mg tablets) taken TWICE daily for the FIRST SEVEN (7) DAYS, then on (enter date)  06/08/20  the dose is reduced to ONE 5 mg tablet taken TWICE daily.  Eliquis may be taken with or without food.   Try to take the dose about the same time in the morning and in the evening. If you have difficulty swallowing the tablet whole please discuss with your pharmacist how to take the medication safely.  Take Eliquis exactly as prescribed and DO NOT stop taking Eliquis without talking to the doctor who prescribed the medication.  Stopping may increase your risk of developing a new blood clot.  Refill your prescription before you run out.  After discharge, you should have regular check-up appointments with your healthcare provider that is prescribing your Eliquis.    What do you do if you miss a dose? If a dose of ELIQUIS is not taken at the scheduled time, take it as soon as possible on the same day and twice-daily administration should be resumed. The dose should not be doubled to make up for a missed dose.  Important Safety Information A possible side effect of Eliquis is bleeding. You should call your healthcare provider right away if you experience any of the following: ? Bleeding from an injury or your nose that does not stop. ? Unusual colored urine (red or dark brown) or unusual colored stools (red or black). ? Unusual bruising for unknown reasons. ? A serious fall or if you hit your head (even if there is no bleeding).  Some medicines may interact with Eliquis and might increase your risk of bleeding or clotting while on  Eliquis. To help avoid this, consult your healthcare provider or pharmacist prior to using any new prescription or non-prescription medications, including herbals, vitamins, non-steroidal anti-inflammatory drugs (NSAIDs) and supplements.  This website has more information on Eliquis (apixaban): http://www.eliquis.com/eliquis/home

## 2020-06-05 DIAGNOSIS — U071 COVID-19: Principal | ICD-10-CM

## 2020-06-05 LAB — FERRITIN: Ferritin: 1177 ng/mL — ABNORMAL HIGH (ref 24–336)

## 2020-06-05 LAB — COMPREHENSIVE METABOLIC PANEL
ALT: 26 U/L (ref 0–44)
AST: 12 U/L — ABNORMAL LOW (ref 15–41)
Albumin: 3.1 g/dL — ABNORMAL LOW (ref 3.5–5.0)
Alkaline Phosphatase: 44 U/L (ref 38–126)
Anion gap: 9 (ref 5–15)
BUN: 31 mg/dL — ABNORMAL HIGH (ref 6–20)
CO2: 30 mmol/L (ref 22–32)
Calcium: 8.5 mg/dL — ABNORMAL LOW (ref 8.9–10.3)
Chloride: 96 mmol/L — ABNORMAL LOW (ref 98–111)
Creatinine, Ser: 0.84 mg/dL (ref 0.61–1.24)
GFR calc Af Amer: >60 mL/min (ref >60)
GFR calc non Af Amer: >60 mL/min (ref >60)
Glucose, Bld: 107 mg/dL — ABNORMAL HIGH (ref 70–99)
Potassium: 4.3 mmol/L (ref 3.5–5.1)
Sodium: 135 mmol/L (ref 135–145)
Total Bilirubin: 1.1 mg/dL (ref 0.3–1.2)
Total Protein: 6.3 g/dL — ABNORMAL LOW (ref 6.5–8.1)

## 2020-06-05 LAB — GLUCOSE, CAPILLARY
Glucose-Capillary: 85 mg/dL (ref 70–99)
Glucose-Capillary: 142 mg/dL — ABNORMAL HIGH (ref 70–99)

## 2020-06-05 LAB — LACTATE DEHYDROGENASE: LDH: 154 U/L (ref 98–192)

## 2020-06-05 LAB — CBC
HCT: 43.5 % (ref 39.0–52.0)
Hemoglobin: 14.2 g/dL (ref 13.0–17.0)
MCH: 29.2 pg (ref 26.0–34.0)
MCHC: 32.6 g/dL (ref 30.0–36.0)
MCV: 89.5 fL (ref 80.0–100.0)
Platelets: 290 10*3/uL (ref 150–400)
RBC: 4.86 MIL/uL (ref 4.22–5.81)
RDW: 12.9 % (ref 11.5–15.5)
WBC: 11.2 10*3/uL — ABNORMAL HIGH (ref 4.0–10.5)
nRBC: 0 % (ref 0.0–0.2)

## 2020-06-05 LAB — MAGNESIUM: Magnesium: 2.8 mg/dL — ABNORMAL HIGH (ref 1.7–2.4)

## 2020-06-05 LAB — C-REACTIVE PROTEIN: CRP: 0.9 mg/dL (ref <1.0)

## 2020-06-05 LAB — PHOSPHORUS: Phosphorus: 4.7 mg/dL — ABNORMAL HIGH (ref 2.5–4.6)

## 2020-06-05 LAB — D-DIMER, QUANTITATIVE: D-Dimer, Quant: 1.93 ug/mL-FEU — ABNORMAL HIGH (ref 0.00–0.50)

## 2020-06-05 MED ORDER — DEXAMETHASONE 6 MG PO TABS: 6.0000 mg | ORAL_TABLET | Freq: Every day | ORAL | 0 refills | Status: AC

## 2020-06-05 MED ORDER — APIXABAN (ELIQUIS) VTE STARTER PACK (10MG AND 5MG): ORAL_TABLET | ORAL | 0 refills | Status: AC

## 2020-06-05 MED ORDER — ALBUTEROL SULFATE HFA 108 (90 BASE) MCG/ACT IN AERS: 2.0000 | INHALATION_SPRAY | Freq: Four times a day (QID) | RESPIRATORY_TRACT | 0 refills | Status: AC | PRN

## 2020-06-05 MED ORDER — SENNOSIDES-DOCUSATE SODIUM 8.6-50 MG PO TABS: 1.0000 | ORAL_TABLET | Freq: Two times a day (BID) | ORAL | Status: DC

## 2020-06-05 NOTE — Progress Notes (Signed)
Pt is being discharged home today. Discharge instructions including medications and follow up appts given. Pt had no further questions at this time. 

## 2020-06-07 NOTE — Discharge Summary (Signed)
[facility] Discharge Summary   Patient: Sean Reynolds QQI:[id number]  PCP: [facility]  Date of admission: 05/28/2020   Date of discharge: 06/05/2020      Discharge Diagnoses:  Principal diagnosis Acute hypoxic respiratory failure.  Active Problems:   Pneumonia due to COVID-19 virus   Acute on chronic respiratory failure with hypoxia (HCC)   Hyperglycemia   Transaminitis   SIRS (systemic inflammatory response syndrome) (HCC)   Pulmonary embolism (HCC)  Admitted From: home Disposition:  Home   Recommendations for Outpatient Follow-up:  1. PCP: please follow up in 1 week 2. Follow up LABS/TEST:  none   Follow-up Information    [facility]. Schedule an appointment as soon as possible for a visit in 1 week(s).   Contact information: [address]  [place] [address] [phone]              Diet recommendation: Cardiac diet  Activity: The patient is advised to gradually reintroduce usual activities, as tolerated  Discharge Condition: stable  Code Status: Full code   History of present illness: As per the H and P dictated on admission, "Sean Reynolds is a 49 y.o. male with no significant medical history who presents with concerns of increasing shortness of breath.  About 10 days ago he was at a pool party and the following day felt like his face was burning and had increasing fatigue and lethargy.  Had chills but no objective fever.  Had frequent productive cough associated with nausea and chest discomfort. Has loss of taste and smell. Denies any vomiting or diarrhea.  He then presented to urgent care about 4 days ago and was found to be Covid positive.  Also later realized that his sister and brother-in-law who were at the pool party also were positive for Covid.  Today he began to feel more shortness of breath and felt like he almost passed out on his way into the ER.  Patient denies any tobacco or illicit drug use.  Has  occasional alcohol use."  Hospital Course:  Summary of his active problems in the hospital is as following. Acute hypoxic respiratory failure. POA. Sepsis secondary to COVID-19 pneumonia. POA. Meeting SIRS criteria with tachycardia and tachypnea. Treated with baricitinib, Solu-Medrol as well as remdesivir. Still needing 3 L of oxygen on discharge.  Prediabetes -9/14 hemoglobin A1c= 6.4 this meets criteria for prediabetes. Diet and exercise suggested. Outpatient follow-up with PCP to discuss medications.  HLD LDL elevated. Recommend outpatient follow-up with PCP and discussion regarding initiation of statin. Dietary modification suggested to the patient.  Essential HTN Blood pressure well controlled. Continue current regimen.  Mild transaminitis AST of 46 and ALT of 52 Secondary to Covid infection. No further work-up for now.  Acute PE --Increaseing O2 demand PE/DVT? --CTA PE protocol positive acute PE --Lower extremity Doppler; negative DVT see results below  -9/17 echocardiogram; negative evidence of right heart strain see results below  -9/20 Eliquis per pharmacy protocol  Hyperkalemia resolved -9/19 Lokelma 10 g x 1  Obesity. Placing the patient at high risk for poor outcome from COVID-19. Body mass index is 35.44 kg/m.   Patient was ambulatory without any assistance. On the day of the discharge the patient's vitals were stable, and no other acute medical condition were reported by patient. The patient was felt safe to be discharge at Home with no therapy needed on discharge.  Consultants: none Procedures: none  Discharge Exam: General: Appear in no distress, no  Rash; Oral Mucosa Clear, moist. no Abnormal Neck Mass Or lumps, Conjunctiva normal  Cardiovascular: S1 and S2 Present, no Murmur Respiratory: increased respiratory effort, Bilateral Air entry present and bilateral  Crackles, no wheezes Abdomen: Bowel Sound present, Soft and no  tenderness Extremities: no Pedal edema Neurology: alert and oriented to time, place, and person affect appropriate. no new focal deficit  Filed Weights   05/28/20 2100  Weight: 108.9 kg   Vitals:   06/04/20 1939 06/05/20 0351  BP: 121/83 121/90  Pulse: 81 65  Resp: 19 20  Temp: 98 F (36.7 C) 97.9 F (36.6 C)  SpO2: 94% 96%    DISCHARGE MEDICATION: Allergies as of 06/05/2020   No Known Allergies     Medication List    STOP taking these medications   warfarin 5 MG tablet Commonly known as: COUMADIN     TAKE these medications   albuterol 108 (90 Base) MCG/ACT inhaler Commonly known as: VENTOLIN HFA Inhale 2 puffs into the lungs every 6 (six) hours as needed for wheezing or shortness of breath.   Apixaban Starter Pack (  and ) Commonly known as: ELIQUIS STARTER PACK Take as directed on package: start with two-5mg  tablets twice daily for 7 days. On day 8, switch to one-5mg  tablet twice daily.   dexamethasone 6 MG tablet Commonly known as: Decadron Take 1 tablet (6 mg total) by mouth daily for 3 days.   QC DAYTIME COLD/FLU PO Take 30 mLs by mouth 2 (two) times daily at 10 AM and 5 PM. For cold and flu symptoms      No Known Allergies Discharge Instructions    Diet - low sodium heart healthy   Complete by: As directed    Increase activity slowly   Complete by: As directed       The results of significant diagnostics from this hospitalization (including imaging, microbiology, ancillary and laboratory) are listed below for reference.    Significant Diagnostic Studies: CT ANGIO CHEST PE W OR WO CONTRAST  Addendum Date: 05/31/2020   ADDENDUM REPORT: 05/31/2020 17:48 ADDENDUM: These results were called by telephone at the time of interpretation on 05/31/2020 at 5:41pm to provider CURTIS WOODS , who verbally acknowledged these results. Electronically Signed   By: Tish Frederickson M.D.   On: 05/31/2020 17:48   Result Date: 05/31/2020 CLINICAL DATA:  COVID 19  positive, 15 L O2, shortness of breath. High probability of PE. EXAM: CT ANGIOGRAPHY CHEST WITH CONTRAST TECHNIQUE: Multidetector CT imaging of the chest was performed using the standard protocol during bolus administration of intravenous contrast. Multiplanar CT image reconstructions and MIPs were obtained to evaluate the vascular anatomy. CONTRAST:  OMNIPAQUE IOHEXOL 350 MG/ML SOLN COMPARISON:  CT chest 08/28/2011 report without images. FINDINGS: Cardiovascular: Satisfactory opacification of the pulmonary arteries to the segmental level. Limited evaluation of the subsegmental level. Difficult to discern hypodensity within the right lower lobe segmental and subsegmental pulmonary arteries may represent pulmonary embolus (8:44, 7:76). The main pulmonary artery is enlarged in size measuring up 3.2 cm. Normal heart size. No pericardial effusion. Normal ventricular ratio, no definite straightening or paradoxical bowing of the interventricular septum, no retrograde reflux of intravenous contrast within the inferior vena cava and hepatic veins. Mediastinum/Nodes: No enlarged mediastinal, hilar, or axillary lymph nodes. Thyroid gland, trachea, and esophagus demonstrate no significant findings. Lungs/Pleura: Extensive diffuse peribronchovascular ground-glass and consolidative opacities. Largely spared the subpleural space. No pleural effusion. No pneumothorax. No findings to suggest pulmonary infarction. Upper Abdomen: Cholelithiasis with  no gallbladder wall thickening or pericholecystic fluid. No acute abnormality. Musculoskeletal: No chest wall abnormality. No suspicious lytic or blastic osseous lesions. Multilevel degenerative change of the spine. No acute displaced fracture. Review of the MIP images confirms the above findings. IMPRESSION: 1. Possible segmental and subsegmental right lower lobe pulmonary embolus. Evaluation is markedly limited due to streak artifact and findings may just be artifactual. No right  heart strain or pulmonary infarction. 2. Extensive multifocal pneumonia consistent with COVID-19 infection. Electronically Signed: By: Tish Frederickson M.D. On: 05/31/2020 17:39   DG Chest Port 1 View  Result Date: 05/28/2020 CLINICAL DATA:  COVID-19 positive EXAM: PORTABLE CHEST 1 VIEW COMPARISON:  Radiograph 08/28/2011 FINDINGS: Multifocal heterogeneous interstitial and airspace opacities are present throughout both lungs without pneumothorax or effusion. The cardiomediastinal contours are unremarkable. No acute osseous or soft tissue abnormality. Telemetry leads overlie the chest. IMPRESSION: Multifocal heterogeneous interstitial and airspace opacities throughout both lungs, compatible with multifocal pneumonia/atypical infection in the setting of COVID-19 positivity. Electronically Signed   By: Kreg Shropshire M.D.   On: 05/28/2020 17:14   ECHOCARDIOGRAM COMPLETE  Result Date: 06/01/2020    ECHOCARDIOGRAM REPORT   Patient Name:   Sean Reynolds Date of Exam: 06/01/2020 Medical Rec #:  [id number]     Height:       69.0 in Accession #:    [id number]    Weight:       240.0 lb Date of Birth:  1970-12-21     BSA:          2.232 m Patient Age:    49 years      BP:           131/91 mmHg Patient Gender: M             HR:           76 bpm. Exam Location:  Inpatient Procedure: 2D Echo, Cardiac Doppler and Color Doppler Indications:    I50.23 Acute on chronic systolic (congestive) heart failure  History:        Patient has no prior history of Echocardiogram examinations.                 COVID-19 Postive.  Sonographer:    Elmarie Shiley Dance Referring Phys: [id number] CURTIS J WOODS IMPRESSIONS  1. Left ventricular ejection fraction, by estimation, is 60 to 65%. The left ventricle has normal function. The left ventricle has no regional wall motion abnormalities. There is moderate concentric left ventricular hypertrophy. Left ventricular diastolic parameters are indeterminate.  2. Right ventricular systolic function is normal. The  right ventricular size is normal. Tricuspid regurgitation signal is inadequate for assessing PA pressure.  3. The mitral valve is normal in structure. No evidence of mitral valve regurgitation. No evidence of mitral stenosis.  4. The aortic valve is tricuspid. Aortic valve regurgitation is not visualized. Mild aortic valve sclerosis is present, with no evidence of aortic valve stenosis.  5. The inferior vena cava is normal in size with greater than 50% respiratory variability, suggesting right atrial pressure of 3 mmHg. FINDINGS  Left Ventricle: Left ventricular ejection fraction, by estimation, is 60 to 65%. The left ventricle has normal function. The left ventricle has no regional wall motion abnormalities. The left ventricular internal cavity size was normal in size. There is  moderate concentric left ventricular hypertrophy. Left ventricular diastolic parameters are indeterminate. Normal left ventricular filling pressure. Right Ventricle: The right ventricular size is normal. No increase in right ventricular wall thickness. Right  ventricular systolic function is normal. Tricuspid regurgitation signal is inadequate for assessing PA pressure. Left Atrium: Left atrial size was normal in size. Right Atrium: Right atrial size was normal in size. Pericardium: There is no evidence of pericardial effusion. Mitral Valve: The mitral valve is normal in structure. No evidence of mitral valve regurgitation. No evidence of mitral valve stenosis. Tricuspid Valve: The tricuspid valve is normal in structure. Tricuspid valve regurgitation is not demonstrated. No evidence of tricuspid stenosis. Aortic Valve: The aortic valve is tricuspid. Aortic valve regurgitation is not visualized. Mild aortic valve sclerosis is present, with no evidence of aortic valve stenosis. Pulmonic Valve: The pulmonic valve was normal in structure. Pulmonic valve regurgitation is trivial. No evidence of pulmonic stenosis. Aorta: The aortic root is normal  in size and structure. Venous: The inferior vena cava is normal in size with greater than 50% respiratory variability, suggesting right atrial pressure of 3 mmHg. IAS/Shunts: No atrial level shunt detected by color flow Doppler.  LEFT VENTRICLE PLAX 2D LVIDd:         5.40 cm  Diastology LVIDs:         2.90 cm  LV e' medial:    6.31 cm/s LV PW:         1.30 cm  LV E/e' medial:  11.3 LV IVS:        1.30 cm  LV e' lateral:   9.03 cm/s LVOT diam:     2.10 cm  LV E/e' lateral: 7.9 LV SV:         65 LV SV Index:   29 LVOT Area:     3.46 cm  RIGHT VENTRICLE             IVC RV Basal diam:  2.30 cm     IVC diam: 1.90 cm RV S prime:     13.70 cm/s TAPSE (M-mode): 2.2 cm LEFT ATRIUM             Index       RIGHT ATRIUM           Index LA diam:        4.70 cm 2.11 cm/m  RA Area:     11.10 cm LA Vol (A2C):   59.1 ml 26.47 ml/m RA Volume:   21.60 ml  9.68 ml/m LA Vol (A4C):   52.5 ml 23.52 ml/m LA Biplane Vol: 57.7 ml 25.85 ml/m  AORTIC VALVE LVOT Vmax:   85.50 cm/s LVOT Vmean:  56.500 cm/s LVOT VTI:    0.187 m  AORTA Ao Root diam: 3.00 cm Ao Asc diam:  3.20 cm MITRAL VALVE MV Area (PHT): 3.03 cm    SHUNTS MV Decel Time: 250 msec    Systemic VTI:  0.19 m MV E velocity: 71.00 cm/s  Systemic Diam: 2.10 cm MV A velocity: 60.90 cm/s MV E/A ratio:  1.17 Armanda Magicraci Turner MD Electronically signed by Armanda Magicraci Turner MD Signature Date/Time: 06/01/2020/11:05:13 AM    Final    VAS US LOWER EXTREMITY VENOUS (DVT)  Result Date: 06/02/2020  Lower Venous DVTStudy Indications: Ddimer elevated.  Comparison Study: no prior Performing Technologist: Blanch MediaMegan Riddle RVS  Examination Guidelines: A complete evaluation includes B-mode imaging, spectral Doppler, color Doppler, and power Doppler as needed of all accessible portions of each vessel. Bilateral testing is considered an integral part of a complete examination. Limited examinations for reoccurring indications may be performed as noted. The reflux portion of the exam is performed with the  patient in reverse  Trendelenburg.  +---------+---------------+---------+-----------+----------+--------------+ RIGHT    CompressibilityPhasicitySpontaneityPropertiesThrombus Aging +---------+---------------+---------+-----------+----------+--------------+ CFV      Full           Yes      Yes                                 +---------+---------------+---------+-----------+----------+--------------+ SFJ      Full                                                        +---------+---------------+---------+-----------+----------+--------------+ FV Prox  Full                                                        +---------+---------------+---------+-----------+----------+--------------+ FV Mid   Full                                                        +---------+---------------+---------+-----------+----------+--------------+ FV DistalFull                                                        +---------+---------------+---------+-----------+----------+--------------+ PFV      Full                                                        +---------+---------------+---------+-----------+----------+--------------+ POP      Full           Yes      Yes                                 +---------+---------------+---------+-----------+----------+--------------+ PTV      Full                                                        +---------+---------------+---------+-----------+----------+--------------+ PERO     Full                                                        +---------+---------------+---------+-----------+----------+--------------+   +---------+---------------+---------+-----------+----------+--------------+ LEFT     CompressibilityPhasicitySpontaneityPropertiesThrombus Aging +---------+---------------+---------+-----------+----------+--------------+ CFV      Full           Yes      Yes                                  +---------+---------------+---------+-----------+----------+--------------+  SFJ      Full                                                        +---------+---------------+---------+-----------+----------+--------------+ FV Prox  Full                                                        +---------+---------------+---------+-----------+----------+--------------+ FV Mid   Full                                                        +---------+---------------+---------+-----------+----------+--------------+ FV DistalFull                                                        +---------+---------------+---------+-----------+----------+--------------+ PFV      Full                                                        +---------+---------------+---------+-----------+----------+--------------+ POP      Full           Yes      Yes                                 +---------+---------------+---------+-----------+----------+--------------+ PTV      Full                                                        +---------+---------------+---------+-----------+----------+--------------+ PERO     Full                                                        +---------+---------------+---------+-----------+----------+--------------+     Summary: BILATERAL: - No evidence of deep vein thrombosis seen in the lower extremities, bilaterally. - No evidence of superficial venous thrombosis in the lower extremities, bilaterally. -   *See table(s) above for measurements and observations. Electronically signed by Lemar Livings MD on 06/02/2020 at 11:05:04 AM.    Final     Microbiology: Recent Results (from the past 240 hour(s))  Blood Culture (routine x 2)     Status: None   Collection Time: 05/28/20  2:25 PM   Specimen: BLOOD LEFT HAND  Result Value Ref Range Status   Specimen Description   Final    BLOOD LEFT HAND  Performed at [facility], [address] [place], [place] [address]    Special Requests   Final    BOTTLES DRAWN AEROBIC AND ANAEROBIC Blood Culture results may not be optimal due to an inadequate volume of blood received in culture bottles Performed at [facility], [address] [place], [place] [address]    Culture   Final    NO GROWTH 5 DAYS Performed at [facility], [address], [place], [place] [address]    Report Status 06/02/2020 FINAL  Final  Blood Culture (routine x 2)     Status: None   Collection Time: 05/28/20  2:27 PM   Specimen: BLOOD  Result Value Ref Range Status   Specimen Description   Final    BLOOD RIGHT ANTECUBITAL Performed at [facility], [address] [place], [place] [address]    Special Requests   Final    BOTTLES DRAWN AEROBIC AND ANAEROBIC Blood Culture adequate volume Performed at [facility], [address] [place], [place] [address]    Culture   Final    NO GROWTH 5 DAYS Performed at [facility], [address], [place], [place] [address]    Report Status 06/02/2020 FINAL  Final  SARS Coronavirus 2 by RT PCR (hospital order, performed in [facility] hospital lab) Nasopharyngeal Nasopharyngeal Swab     Status: Abnormal   Collection Time: 05/28/20  3:59 PM   Specimen: Nasopharyngeal Swab  Result Value Ref Range Status   SARS Coronavirus 2 POSITIVE (A) NEGATIVE Final    Comment: RESULT CALLED TO, READ BACK BY AND VERIFIED WITH: WEST,S. RN @1747  05/28/20 BILLINGSLEY,L (NOTE) SARS-CoV-2 target nucleic acids are DETECTED  SARS-CoV-2 RNA is generally detectable in upper respiratory specimens  during the acute phase of infection.  Positive results are indicative  of the presence of the identified virus, but do not rule out bacterial infection or co-infection with other pathogens not detected by the test.  Clinical correlation with patient history and  other diagnostic information is necessary to determine  patient infection status.  The expected result is negative.  Fact Sheet for Patients:   05/30/20   Fact Sheet for Healthcare Providers:   BoilerBrush.com.cy    This test is not yet approved or cleared by the https://pope.com/ FDA and  has been authorized for detection and/or diagnosis of SARS-CoV-2 by FDA under an Emergency Use Authorization (EUA).  This EUA will remain in effect (meaning thi s test can be used) for the duration of  the COVID-19 declaration under Section 564(b)(1) of the Act, 21 U.S.C. section 360-bbb-3(b)(1), unless the authorization is terminated or revoked sooner.  Performed at [facility], [address], [place], [place]      Labs: CBC: Recent Labs  Lab 06/01/20 0422 06/02/20 0530 06/03/20 0512 06/05/20 0359  WBC 14.2* 14.0* 14.3* 11.2*  NEUTROABS 11.2* 11.4* 12.0*  --   HGB 13.6 14.2 14.3 14.2  HCT 42.1 43.2 44.6 43.5  MCV 89.2 88.3 89.0 89.5  PLT 201 253 265 290   Basic Metabolic Panel: Recent Labs  Lab 06/01/20 0422 06/02/20 0530 06/03/20 0512 06/04/20 0529 06/05/20 0359  NA 136 135 137  --  135  K 5.3* 4.8 5.0  --  4.3  CL 101 96* 96*  --  96*  CO2 24 29 29   --  30  GLUCOSE 245* 251* 234*  --  107*  BUN 26* 27* 33*  --  31*  CREATININE 0.83 0.78 0.88  --  0.84  CALCIUM 8.4* 8.7* 9.1  --  8.5*  MG 2.3 2.5* 2.4 3.1* 2.8*  PHOS 4.8* 5.1* 4.8* 5.2* 4.7*   Liver Function Tests: Recent Labs  Lab 06/01/20 0422 06/02/20 0530 06/03/20 0512 06/05/20 0359  AST 22 12* 13* 12*  ALT 52* 41 35 26  ALKPHOS 59 60 53 44  BILITOT 0.8 0.9 0.7 1.1  PROT 6.6 6.9 6.7 6.3*  ALBUMIN 3.0* 3.2* 3.0* 3.1*   CBG: Recent Labs  Lab 06/04/20 1056 06/04/20 1540 06/04/20 1942 06/05/20 0741 06/05/20 1128  GLUCAP 215* 182* 251* 85 142*    Time spent: 35 minutes  Signed:  Lynden Oxford  [facility] 06/05/2020 10:03 AM
